# Patient Record
Sex: Male | Born: 1935 | Race: White | Hispanic: No | Marital: Married | Smoking: Former smoker
Health system: Southern US, Community
[De-identification: ages and names within clinical notes are randomized; demographics above are authoritative.]

## PROBLEM LIST (undated history)

## (undated) DIAGNOSIS — M1990 Unspecified osteoarthritis, unspecified site: Secondary | ICD-10-CM

## (undated) DIAGNOSIS — Z950 Presence of cardiac pacemaker: Secondary | ICD-10-CM

## (undated) DIAGNOSIS — G4730 Sleep apnea, unspecified: Secondary | ICD-10-CM

## (undated) DIAGNOSIS — I639 Cerebral infarction, unspecified: Secondary | ICD-10-CM

## (undated) DIAGNOSIS — R413 Other amnesia: Secondary | ICD-10-CM

## (undated) DIAGNOSIS — M47816 Spondylosis without myelopathy or radiculopathy, lumbar region: Secondary | ICD-10-CM

## (undated) DIAGNOSIS — G8929 Other chronic pain: Secondary | ICD-10-CM

## (undated) DIAGNOSIS — E119 Type 2 diabetes mellitus without complications: Secondary | ICD-10-CM

## (undated) DIAGNOSIS — I10 Essential (primary) hypertension: Secondary | ICD-10-CM

## (undated) DIAGNOSIS — K759 Inflammatory liver disease, unspecified: Secondary | ICD-10-CM

## (undated) DIAGNOSIS — R251 Tremor, unspecified: Secondary | ICD-10-CM

## (undated) HISTORY — PX: PACEMAKER INSERTION: SHX728

## (undated) HISTORY — PX: LAPAROSCOPIC ABLATION RENAL MASS: SUR751

---

## 1989-11-18 DIAGNOSIS — I2510 Atherosclerotic heart disease of native coronary artery without angina pectoris: Secondary | ICD-10-CM

## 1989-11-18 HISTORY — DX: Atherosclerotic heart disease of native coronary artery without angina pectoris: I25.10

## 1989-11-18 HISTORY — PX: CORONARY ARTERY BYPASS GRAFT: SHX141

## 2019-07-27 ENCOUNTER — Other Ambulatory Visit: Payer: Self-pay | Admitting: Urology

## 2019-07-27 DIAGNOSIS — N2889 Other specified disorders of kidney and ureter: Secondary | ICD-10-CM

## 2019-07-28 ENCOUNTER — Other Ambulatory Visit: Payer: Self-pay

## 2019-07-28 ENCOUNTER — Encounter: Payer: Self-pay | Admitting: *Deleted

## 2019-07-28 ENCOUNTER — Ambulatory Visit
Admission: RE | Admit: 2019-07-28 | Discharge: 2019-07-28 | Disposition: A | Discharge status: Home / Self Care | Payer: Medicare Other | Source: Ambulatory Visit | Attending: Urology | Admitting: Urology

## 2019-07-28 DIAGNOSIS — N2889 Other specified disorders of kidney and ureter: Secondary | ICD-10-CM

## 2019-07-28 HISTORY — PX: IR RADIOLOGIST EVAL & MGMT: IMG5224

## 2019-07-28 NOTE — Final Consult Note (Signed)
Chief Complaint: Patient was consulted remotely today (TeleHealth) for Right renal mass at the request of Hall,Marshall C.    Referring Physician(s): Hall,Marshall C  History of Present Illness: Jake Woods is a 83 y.o. male with multiple comorbidities including known coronary disease, previous carotid bypass, hypertension, diabetes, mild renal insufficiency, and previous pacemaker.  He presents for consultation to discuss an incidentally found right renal mass consistent with a renal cell carcinoma by imaging.  By contrast CT has a 3 cm exophytic upper pole solid enhancing renal mass consistent with a renal cell carcinoma.  Additional small bilateral scratch that additional bilateral renal cyst noted.  No renal obstruction or hydronephrosis.  No adenopathy.  Patient remains asymptomatic.  No significant abdominal pain or flank pain.  No hematuria or dysuria.  He does have chronic kidney disease with a mildly elevated creatinine of 1.68.  He also has a history of longstanding BPH.  No past medical history on file.    Allergies: Patient has no allergy information on record.  Medications: Prior to Admission medications   Not on File     No family history on file.  Social History   Socioeconomic History  . Marital status: Not on file    Spouse name: Not on file  . Number of children: Not on file  . Years of education: Not on file  . Highest education level: Not on file  Occupational History  . Not on file  Social Needs  . Financial resource strain: Not on file  . Food insecurity    Worry: Not on file    Inability: Not on file  . Transportation needs    Medical: Not on file    Non-medical: Not on file  Tobacco Use  . Smoking status: Not on file  Substance and Sexual Activity  . Alcohol use: Not on file  . Drug use: Not on file  . Sexual activity: Not on file  Lifestyle  . Physical activity    Days per week: Not on file    Minutes per session: Not on file  .  Stress: Not on file  Relationships  . Social Herbalist on phone: Not on file    Gets together: Not on file    Attends religious service: Not on file    Active member of club or organization: Not on file    Attends meetings of clubs or organizations: Not on file    Relationship status: Not on file  Other Topics Concern  . Not on file  Social History Narrative  . Not on file    Review of Systems  Review of Systems: A 12 point ROS discussed and pertinent positives are indicated in the HPI above.  All other systems are negative.  Physical Exam No direct physical exam was performed, tele visit only today Vital Signs: There were no vitals taken for this visit.  Imaging: No results found.  Labs:  CBC: No results for input(s): WBC, HGB, HCT, PLT in the last 8760 hours.  COAGS: No results for input(s): INR, APTT in the last 8760 hours.  BMP: No results for input(s): NA, K, CL, CO2, GLUCOSE, BUN, CALCIUM, CREATININE, GFRNONAA, GFRAA in the last 8760 hours.  Invalid input(s): CMP  LIVER FUNCTION TESTS: No results for input(s): BILITOT, AST, ALT, ALKPHOS, PROT, ALBUMIN in the last 8760 hours.  TUMOR MARKERS: No results for input(s): AFPTM, CEA, CA199, CHROMGRNA in the last 8760 hours.  Assessment and Plan:  Incidentally found 3 cm solid enhancing exophytic right upper pole renal mass compatible with renal cell carcinoma.  Lesion size and location is amenable to image guided cryoablation.  The procedure, risk, benefits and alternatives were reviewed.  The need for general anesthesia was reviewed.  The expected goals, recovery and outcomes as well as continued imaging surveillance were reviewed.  All questions were addressed.  They have a clear understanding of the procedure.  Surveillance was also discussed.  After discussion they would like to proceed with scheduling the CT-guided cryoablation and biopsy at [facility].  Plan: Scheduled for CT-guided right  renal mass biopsy and cryoablation at [facility] in the next few weeks electively.  Thank you for this interesting consult.  I greatly enjoyed meeting Jake Woods and look forward to participating in their care.  A copy of this report was sent to the requesting provider on this date.  Electronically Signed: Berdine DanceMichael Mahari Strahm 07/28/2019, 3:08 PM   I spent a total of  40 Minutes   in remote  clinical consultation, greater than 50% of which was counseling/coordinating care for this patient with a 3 cm right renal cell carcinoma.    Visit type: Audio only (telephone). Audio (no video) only due to patient's lack of internet/smartphone capability. Alternative for in-person consultation at [facility], [address], [place], [place]. This visit type was conducted due to national recommendations for restrictions regarding the COVID-19 Pandemic (e.g. social distancing).  This format is felt to be most appropriate for this patient at this time.  All issues noted in this document were discussed and addressed.

## 2019-10-12 ENCOUNTER — Other Ambulatory Visit: Payer: Self-pay | Admitting: Interventional Radiology

## 2019-10-12 DIAGNOSIS — N2889 Other specified disorders of kidney and ureter: Secondary | ICD-10-CM

## 2019-10-28 ENCOUNTER — Ambulatory Visit
Admission: RE | Admit: 2019-10-28 | Discharge: 2019-10-28 | Disposition: A | Discharge status: Home / Self Care | Payer: Medicare Other | Source: Ambulatory Visit | Attending: Interventional Radiology | Admitting: Interventional Radiology

## 2019-10-28 ENCOUNTER — Other Ambulatory Visit: Payer: Self-pay

## 2019-10-28 DIAGNOSIS — N2889 Other specified disorders of kidney and ureter: Secondary | ICD-10-CM

## 2019-10-28 HISTORY — PX: IR RADIOLOGIST EVAL & MGMT: IMG5224

## 2019-10-28 NOTE — Progress Notes (Signed)
Patient ID: Jake Woods, male   DOB: 1935/11/29, 83 y.o.   MRN: [id number]       Chief Complaint:  1 month status post right renal mass cryoablation and biopsy.  Telehealth follow-up because of Covid pandemic.  Referring Physician(s): Dr. Nevada Crane  History of Present Illness: Jake Woods is a 83 y.o. male with multiple comorbidities including known coronary disease, previous carotid bypass, hypertension, diabetes, and pacemaker.  He was found to have a 3 cm exophytic upper pole solid enhancing renal mass in the right kidney.  This was successfully treated with CT-guided cryoablation and biopsy 1 month ago at [facility].  Procedure went well without complication.  Over the last month he has recovered well.  No current flank or abdominal pain.  No dysuria, hematuria or other urinary tract symptoms.  No recent illness or fever.  Overall he is doing very well.  Telehealth visit today for follow-up.  No interval imaging.  Pathology revealed renal oncocytoma.  No past medical history on file.    Allergies: Patient has no allergy information on record.  Medications: Prior to Admission medications   Not on File     No family history on file.  Social History   Socioeconomic History  . Marital status: Not on file    Spouse name: Not on file  . Number of children: Not on file  . Years of education: Not on file  . Highest education level: Not on file  Occupational History  . Not on file  Tobacco Use  . Smoking status: Not on file  Substance and Sexual Activity  . Alcohol use: Not on file  . Drug use: Not on file  . Sexual activity: Not on file  Other Topics Concern  . Not on file  Social History Narrative  . Not on file   Social Determinants of Health   Financial Resource Strain:   . Difficulty of Paying Living Expenses: Not on file  Food Insecurity:   . Worried About Charity fundraiser in the Last Year: Not on file  . Ran Out of Food in the Last Year: Not on file   Transportation Needs:   . Lack of Transportation (Medical): Not on file  . Lack of Transportation (Non-Medical): Not on file  Physical Activity:   . Days of Exercise per Week: Not on file  . Minutes of Exercise per Session: Not on file  Stress:   . Feeling of Stress : Not on file  Social Connections:   . Frequency of Communication with Friends and Family: Not on file  . Frequency of Social Gatherings with Friends and Family: Not on file  . Attends Religious Services: Not on file  . Active Member of Clubs or Organizations: Not on file  . Attends Archivist Meetings: Not on file  . Marital Status: Not on file     Review of Systems: A 12 point ROS discussed and pertinent positives are indicated in the HPI above.  All other systems are negative.  Physical Exam No direct physical exam was performed, telephone visit only today because of Covid pandemic. Vital Signs: There were no vitals taken for this visit.  Imaging: No results found.  Labs:  CBC: No results for input(s): WBC, HGB, HCT, PLT in the last 8760 hours.  COAGS: No results for input(s): INR, APTT in the last 8760 hours.  BMP: No results for input(s): NA, K, CL, CO2, GLUCOSE, BUN, CALCIUM, CREATININE, GFRNONAA, GFRAA in the last  8760 hours.  Invalid input(s): CMP  LIVER FUNCTION TESTS: No results for input(s): BILITOT, AST, ALT, ALKPHOS, PROT, ALBUMIN in the last 8760 hours.  TUMOR MARKERS: No results for input(s): AFPTM, CEA, CA199, CHROMGRNA in the last 8760 hours.  Assessment and Plan:  1 month status post right renal mass biopsy and cryoablation performed at [facility].  He has recovered at home very well.  No delayed complication or symptoms.  Biopsy revealed renal oncocytoma.  Pathology reviewed with the patient and his family.  Today's visit was by telephone.  All questions and concerns were addressed.  Plan: Initial outpatient follow-up CT without and with contrast in 3 months to be  performed in [facility].  Thank you for this interesting consult.  I greatly enjoyed meeting Jake Woods and look forward to participating in their care.  A copy of this report was sent to the requesting provider on this date.  Electronically Signed: Berdine Dance 10/28/2019, 10:35 AM   I spent a total of    40 Minutes in remote  clinical consultation, greater than 50% of which was counseling/coordinating care for this patient status post cryoablation of a renal oncocytoma.    Visit type: Audio only (telephone). Audio (no video) only due to patient's lack of internet/smartphone capability. Alternative for in-person consultation at [facility], [address] [place], [place], [place]. This visit type was conducted due to national recommendations for restrictions regarding the COVID-19 Pandemic (e.g. social distancing).  This format is felt to be most appropriate for this patient at this time.  All issues noted in this document were discussed and addressed.

## 2020-01-10 ENCOUNTER — Other Ambulatory Visit: Payer: Self-pay | Admitting: Interventional Radiology

## 2020-01-10 ENCOUNTER — Other Ambulatory Visit: Payer: Self-pay

## 2020-01-10 DIAGNOSIS — N2889 Other specified disorders of kidney and ureter: Secondary | ICD-10-CM

## 2020-01-13 ENCOUNTER — Other Ambulatory Visit: Payer: Self-pay | Admitting: Otolaryngology

## 2020-01-18 ENCOUNTER — Other Ambulatory Visit: Payer: Self-pay

## 2020-01-18 ENCOUNTER — Encounter (HOSPITAL_BASED_OUTPATIENT_CLINIC_OR_DEPARTMENT_OTHER): Payer: Self-pay | Admitting: Otolaryngology

## 2020-01-20 ENCOUNTER — Other Ambulatory Visit (HOSPITAL_COMMUNITY)
Admission: RE | Admit: 2020-01-20 | Discharge: 2020-01-20 | Disposition: A | Discharge status: Home / Self Care | Payer: Medicare Other | Source: Ambulatory Visit | Attending: Otolaryngology | Admitting: Otolaryngology

## 2020-01-20 ENCOUNTER — Encounter (HOSPITAL_BASED_OUTPATIENT_CLINIC_OR_DEPARTMENT_OTHER)
Admission: RE | Admit: 2020-01-20 | Discharge: 2020-01-20 | Disposition: A | Discharge status: Home / Self Care | Payer: Medicare Other | Source: Ambulatory Visit | Attending: Otolaryngology | Admitting: Otolaryngology

## 2020-01-20 DIAGNOSIS — Z20822 Contact with and (suspected) exposure to covid-19: Secondary | ICD-10-CM | POA: Diagnosis not present

## 2020-01-20 DIAGNOSIS — Z01812 Encounter for preprocedural laboratory examination: Secondary | ICD-10-CM | POA: Diagnosis present

## 2020-01-20 LAB — BASIC METABOLIC PANEL
Anion gap: 8 (ref 5–15)
BUN: 42 mg/dL — ABNORMAL HIGH (ref 8–23)
CO2: 23 mmol/L (ref 22–32)
Calcium: 9.2 mg/dL (ref 8.9–10.3)
Chloride: 111 mmol/L (ref 98–111)
Creatinine, Ser: 1.88 mg/dL — ABNORMAL HIGH (ref 0.61–1.24)
GFR calc Af Amer: 38 mL/min — ABNORMAL LOW (ref >60)
GFR calc non Af Amer: 33 mL/min — ABNORMAL LOW (ref >60)
Glucose, Bld: 114 mg/dL — ABNORMAL HIGH (ref 70–99)
Potassium: 4.6 mmol/L (ref 3.5–5.1)
Sodium: 142 mmol/L (ref 135–145)

## 2020-01-20 LAB — SARS CORONAVIRUS 2 (TAT 6-24 HRS): SARS Coronavirus 2: NEGATIVE

## 2020-01-21 NOTE — Progress Notes (Signed)
Notified Dr. Jenne Pane office Marian Sorrow) that we need medical clearance for this patient prior to surgery on 01/24/20, per Dr. Chilton Si (anesthesiologist)

## 2020-01-24 ENCOUNTER — Ambulatory Visit (HOSPITAL_BASED_OUTPATIENT_CLINIC_OR_DEPARTMENT_OTHER): Admission: RE | Admit: 2020-01-24 | Payer: Medicare Other | Source: Home / Self Care | Admitting: Otolaryngology

## 2020-01-24 HISTORY — DX: Presence of cardiac pacemaker: Z95.0

## 2020-01-24 HISTORY — DX: Type 2 diabetes mellitus without complications: E11.9

## 2020-01-24 HISTORY — DX: Sleep apnea, unspecified: G47.30

## 2020-01-24 HISTORY — DX: Tremor, unspecified: R25.1

## 2020-01-24 HISTORY — DX: Unspecified osteoarthritis, unspecified site: M19.90

## 2020-01-24 HISTORY — DX: Essential (primary) hypertension: I10

## 2020-01-24 HISTORY — DX: Other chronic pain: G89.29

## 2020-01-24 HISTORY — DX: Other amnesia: R41.3

## 2020-01-24 SURGERY — DRUG INDUCED SLEEP ENDOSCOPY
Anesthesia: General

## 2020-02-15 ENCOUNTER — Encounter: Payer: Self-pay | Admitting: *Deleted

## 2020-02-15 ENCOUNTER — Other Ambulatory Visit: Payer: Self-pay

## 2020-02-15 ENCOUNTER — Ambulatory Visit
Admission: RE | Admit: 2020-02-15 | Discharge: 2020-02-15 | Disposition: A | Discharge status: Home / Self Care | Payer: Medicare Other | Source: Ambulatory Visit | Attending: Interventional Radiology | Admitting: Interventional Radiology

## 2020-02-15 DIAGNOSIS — N2889 Other specified disorders of kidney and ureter: Secondary | ICD-10-CM

## 2020-02-15 HISTORY — PX: IR RADIOLOGIST EVAL & MGMT: IMG5224

## 2020-02-15 NOTE — Progress Notes (Signed)
Patient ID: Jake Woods, male   DOB: 1936/02/07, 84 y.o.   MRN: [id number]       Chief Complaint:  83-month status post right renal mass cryoablation and biopsy.  Telehealth follow-up because of Covid pandemic  Referring Physician(s): Dr. Nevada Crane  History of Present Illness: Jake Woods is a 84 y.o. male with multiple comorbidities including heart disease, previous bypass, pacemaker.  Patient was found to have a 3 cm exophytic upper pole solid enhancing mass in the right kidney.  This underwent successful CT-guided cryoablation and biopsy 09/30/2019 at [facility].  Biopsy confirmed oncocytoma.  He continues to do very well.  He is completely recovered.  No current flank abdominal pain.  No other urinary tract symptoms.  No recent illness or fever.  Overall he is doing very well.  Interval surveillance CT demonstrates stable ablation changes.  No residual or recurrent enhancing abnormality.  This will serve as his new baseline exam.  No new renal abnormality.  No delayed complication.  Past Medical History:  Diagnosis Date  . Arthritis    hands  . Chronic back pain   . Coronary artery disease 1991   CABG at [facility]  . Diabetes mellitus without complication ([place])   . Hypertension   . Memory deficits   . Presence of permanent cardiac pacemaker    SSS  . Sleep apnea    did not tolerate CPAP  . Tremor     Past Surgical History:  Procedure Laterality Date  . CORONARY ARTERY BYPASS GRAFT  1991  . IR RADIOLOGIST EVAL & MGMT  07/28/2019  . IR RADIOLOGIST EVAL & MGMT  10/28/2019  . LAPAROSCOPIC ABLATION RENAL MASS Right   . PACEMAKER INSERTION      Allergies: Ambien [zolpidem], Penicillins, and Viagra [sildenafil]  Medications: Prior to Admission medications   Medication Sig Start Date End Date Taking? Authorizing Provider  aspirin EC 81 MG tablet Take 81 mg by mouth daily.    [provider]  atorvastatin (LIPITOR) 80 MG tablet Take 80 mg by mouth daily.    [provider]  celecoxib (CELEBREX) 100 MG capsule Take 100 mg by mouth 2 (two) times daily.    [provider]  donepezil (ARICEPT) 10 MG tablet Take 10 mg by mouth at bedtime.    [provider]  glucosamine-chondroitin 500-400 MG tablet Take 1 tablet by mouth 3 (three) times daily.    [provider]  memantine (NAMENDA) 10 MG tablet Take 10 mg by mouth 2 (two) times daily.    [provider]  metFORMIN (GLUCOPHAGE-XR) 500 MG 24 hr tablet Take 500 mg by mouth daily with breakfast.    [provider]  metoprolol succinate (TOPROL-XL) 50 MG 24 hr tablet Take 50 mg by mouth daily. Take with or immediately following a meal.    [provider]  Multiple Vitamin (MULTIVITAMIN WITH MINERALS) TABS tablet Take 1 tablet by mouth daily.    [provider]  Multiple Vitamins-Minerals (PRESERVISION AREDS 2 PO) Take by mouth.    [provider]  Omega 3 1200 MG CAPS Take by mouth.    [provider]  silodosin (RAPAFLO) 4 MG CAPS capsule Take 4 mg by mouth daily with breakfast.    [provider]  telmisartan-hydrochlorothiazide (MICARDIS HCT) 80-25 MG tablet Take 1 tablet by mouth daily.    [provider]  tolterodine (DETROL LA) 4 MG 24 hr capsule Take 4 mg by mouth daily.  [provider]  vitamin B-12 (CYANOCOBALAMIN) 100 MCG tablet Take 100 mcg by mouth daily.    [provider]     No family history on file.  Social History   Socioeconomic History  . Marital status: Married    Spouse name: Not on file  . Number of children: Not on file  . Years of education: Not on file  . Highest education level: Not on file  Occupational History  . Not on file  Tobacco Use  . Smoking status: Former Games developer  . Smokeless tobacco: Never Used  Substance and Sexual Activity  . Alcohol use: Never  . Drug use: Never  . Sexual activity: Not on file  Other Topics Concern  . Not on file    Social History Narrative  . Not on file   Social Determinants of Health   Financial Resource Strain:   . Difficulty of Paying Living Expenses:   Food Insecurity:   . Worried About Programme researcher, broadcasting/film/video in the Last Year:   . Barista in the Last Year:   Transportation Needs:   . Freight forwarder (Medical):   Marland Kitchen Lack of Transportation (Non-Medical):   Physical Activity:   . Days of Exercise per Week:   . Minutes of Exercise per Session:   Stress:   . Feeling of Stress :   Social Connections:   . Frequency of Communication with Friends and Family:   . Frequency of Social Gatherings with Friends and Family:   . Attends Religious Services:   . Active Member of Clubs or Organizations:   . Attends Banker Meetings:   Marland Kitchen Marital Status:      Review of Systems  Review of Systems: A 12 point ROS discussed and pertinent positives are indicated in the HPI above.  All other systems are negative.  Physical Exam No direct physical exam was performed, telehealth visit only today because of Covid pandemic Vital Signs: There were no vitals taken for this visit.  Imaging: No results found.  Labs:  CBC: No results for input(s): WBC, HGB, HCT, PLT in the last 8760 hours.  COAGS: No results for input(s): INR, APTT in the last 8760 hours.  BMP: Recent Labs    01/20/20 0900  NA 142  K 4.6  CL 111  CO2 23  GLUCOSE 114*  BUN 42*  CALCIUM 9.2  CREATININE 1.88*  GFRNONAA 33*  GFRAA 38*    LIVER FUNCTION TESTS: No results for input(s): BILITOT, AST, ALT, ALKPHOS, PROT, ALBUMIN in the last 8760 hours.  Assessment and Plan:  1-month status post right renal mass biopsy and cryoablation.  Biopsy confirmed renal oncocytoma.  Surveillance imaging demonstrates expected post ablation defect with no residual enhancing abnormality.  No delayed complication.  Imaging findings reviewed with the patient today by telephone including his family.  All questions and  concerns addressed.  Plan: Repeat outpatient follow-up CT without and with contrast in 1 year   Electronically Signed: Berdine Dance 02/15/2020, 10:17 AM   I spent a total of    25 Minutes in remote  clinical consultation, greater than 50% of which was counseling/coordinating care for this patient status post cryoablation of a renal oncocytoma.    Visit type: Audio only (telephone). Audio (no video) only due to patient's lack of internet/smartphone capability. Alternative for in-person consultation at [facility], [address] [place], [place], [place]. This visit type was conducted due to national recommendations for restrictions regarding the  COVID-19 Pandemic (e.g. social distancing).  This format is felt to be most appropriate for this patient at this time.  All issues noted in this document were discussed and addressed.

## 2020-03-29 ENCOUNTER — Other Ambulatory Visit: Payer: Self-pay | Admitting: Otolaryngology

## 2020-04-27 ENCOUNTER — Other Ambulatory Visit: Payer: Self-pay

## 2020-04-27 ENCOUNTER — Encounter (HOSPITAL_BASED_OUTPATIENT_CLINIC_OR_DEPARTMENT_OTHER): Payer: Self-pay | Admitting: Otolaryngology

## 2020-04-27 NOTE — Progress Notes (Signed)
Follow up message left for Tami Lin, surgery scheduler with Dr Jenne Pane re: need for cardiac clearance prior to scheduled surgery.  Previous notification to her was 6/7 by Dianna Rossetti, RN

## 2020-04-29 ENCOUNTER — Other Ambulatory Visit (HOSPITAL_COMMUNITY): Payer: Medicare Other | Attending: Otolaryngology

## 2020-05-01 ENCOUNTER — Other Ambulatory Visit (HOSPITAL_COMMUNITY)
Admission: RE | Admit: 2020-05-01 | Discharge: 2020-05-01 | Disposition: A | Discharge status: Home / Self Care | Payer: Medicare Other | Source: Ambulatory Visit | Attending: Otolaryngology | Admitting: Otolaryngology

## 2020-05-01 ENCOUNTER — Encounter (HOSPITAL_BASED_OUTPATIENT_CLINIC_OR_DEPARTMENT_OTHER)
Admission: RE | Admit: 2020-05-01 | Discharge: 2020-05-01 | Disposition: A | Discharge status: Home / Self Care | Payer: Medicare Other | Source: Ambulatory Visit | Attending: Otolaryngology | Admitting: Otolaryngology

## 2020-05-01 DIAGNOSIS — Z01812 Encounter for preprocedural laboratory examination: Secondary | ICD-10-CM | POA: Diagnosis present

## 2020-05-01 DIAGNOSIS — Z20822 Contact with and (suspected) exposure to covid-19: Secondary | ICD-10-CM | POA: Insufficient documentation

## 2020-05-01 LAB — BASIC METABOLIC PANEL
Anion gap: 7 (ref 5–15)
BUN: 25 mg/dL — ABNORMAL HIGH (ref 8–23)
CO2: 23 mmol/L (ref 22–32)
Calcium: 8.9 mg/dL (ref 8.9–10.3)
Chloride: 111 mmol/L (ref 98–111)
Creatinine, Ser: 1.24 mg/dL (ref 0.61–1.24)
GFR calc Af Amer: >60 mL/min (ref >60)
GFR calc non Af Amer: 54 mL/min — ABNORMAL LOW (ref >60)
Glucose, Bld: 115 mg/dL — ABNORMAL HIGH (ref 70–99)
Potassium: 4.1 mmol/L (ref 3.5–5.1)
Sodium: 141 mmol/L (ref 135–145)

## 2020-05-01 LAB — SARS CORONAVIRUS 2 (TAT 6-24 HRS): SARS Coronavirus 2: NEGATIVE

## 2020-05-03 ENCOUNTER — Other Ambulatory Visit: Payer: Self-pay

## 2020-05-03 ENCOUNTER — Encounter (HOSPITAL_BASED_OUTPATIENT_CLINIC_OR_DEPARTMENT_OTHER): Payer: Self-pay | Admitting: Otolaryngology

## 2020-05-03 ENCOUNTER — Ambulatory Visit (HOSPITAL_BASED_OUTPATIENT_CLINIC_OR_DEPARTMENT_OTHER): Payer: Medicare Other | Admitting: Certified Registered Nurse Anesthetist

## 2020-05-03 ENCOUNTER — Ambulatory Visit (HOSPITAL_BASED_OUTPATIENT_CLINIC_OR_DEPARTMENT_OTHER)
Admission: RE | Admit: 2020-05-03 | Discharge: 2020-05-03 | Disposition: A | Discharge status: Home / Self Care | Payer: Medicare Other | Attending: Otolaryngology | Admitting: Otolaryngology

## 2020-05-03 ENCOUNTER — Encounter (HOSPITAL_BASED_OUTPATIENT_CLINIC_OR_DEPARTMENT_OTHER)
Admission: RE | Disposition: A | Discharge status: Home / Self Care | Payer: Self-pay | Source: Home / Self Care | Attending: Otolaryngology

## 2020-05-03 DIAGNOSIS — Z951 Presence of aortocoronary bypass graft: Secondary | ICD-10-CM | POA: Insufficient documentation

## 2020-05-03 DIAGNOSIS — Z79899 Other long term (current) drug therapy: Secondary | ICD-10-CM | POA: Diagnosis not present

## 2020-05-03 DIAGNOSIS — I495 Sick sinus syndrome: Secondary | ICD-10-CM | POA: Insufficient documentation

## 2020-05-03 DIAGNOSIS — G4733 Obstructive sleep apnea (adult) (pediatric): Secondary | ICD-10-CM | POA: Diagnosis not present

## 2020-05-03 DIAGNOSIS — I2510 Atherosclerotic heart disease of native coronary artery without angina pectoris: Secondary | ICD-10-CM | POA: Insufficient documentation

## 2020-05-03 DIAGNOSIS — M19041 Primary osteoarthritis, right hand: Secondary | ICD-10-CM | POA: Insufficient documentation

## 2020-05-03 DIAGNOSIS — Z880 Allergy status to penicillin: Secondary | ICD-10-CM | POA: Diagnosis not present

## 2020-05-03 DIAGNOSIS — M19042 Primary osteoarthritis, left hand: Secondary | ICD-10-CM | POA: Insufficient documentation

## 2020-05-03 DIAGNOSIS — Z888 Allergy status to other drugs, medicaments and biological substances status: Secondary | ICD-10-CM | POA: Diagnosis not present

## 2020-05-03 DIAGNOSIS — Z7984 Long term (current) use of oral hypoglycemic drugs: Secondary | ICD-10-CM | POA: Insufficient documentation

## 2020-05-03 DIAGNOSIS — Z7982 Long term (current) use of aspirin: Secondary | ICD-10-CM | POA: Diagnosis not present

## 2020-05-03 DIAGNOSIS — E119 Type 2 diabetes mellitus without complications: Secondary | ICD-10-CM | POA: Insufficient documentation

## 2020-05-03 DIAGNOSIS — Z87891 Personal history of nicotine dependence: Secondary | ICD-10-CM | POA: Diagnosis not present

## 2020-05-03 DIAGNOSIS — Z8673 Personal history of transient ischemic attack (TIA), and cerebral infarction without residual deficits: Secondary | ICD-10-CM | POA: Diagnosis not present

## 2020-05-03 DIAGNOSIS — Z791 Long term (current) use of non-steroidal anti-inflammatories (NSAID): Secondary | ICD-10-CM | POA: Insufficient documentation

## 2020-05-03 DIAGNOSIS — Z950 Presence of cardiac pacemaker: Secondary | ICD-10-CM | POA: Insufficient documentation

## 2020-05-03 DIAGNOSIS — I10 Essential (primary) hypertension: Secondary | ICD-10-CM | POA: Insufficient documentation

## 2020-05-03 HISTORY — DX: Spondylosis without myelopathy or radiculopathy, lumbar region: M47.816

## 2020-05-03 HISTORY — PX: DRUG INDUCED ENDOSCOPY: SHX6808

## 2020-05-03 HISTORY — DX: Cerebral infarction, unspecified: I63.9

## 2020-05-03 SURGERY — DRUG INDUCED SLEEP ENDOSCOPY
Anesthesia: General | Site: Nose

## 2020-05-03 MED ORDER — OXYCODONE HCL 5 MG PO TABS: 5.0000 mg | ORAL_TABLET | Freq: Once | ORAL | Status: DC | PRN

## 2020-05-03 MED ORDER — LIDOCAINE 2% (20 MG/ML) 5 ML SYRINGE
INTRAMUSCULAR | Status: AC
  Filled 2020-05-03: qty 5

## 2020-05-03 MED ORDER — PROPOFOL 10 MG/ML IV BOLUS
INTRAVENOUS | Status: DC | PRN
  Administered 2020-05-03: 10 mg via INTRAVENOUS

## 2020-05-03 MED ORDER — OXYMETAZOLINE HCL 0.05 % NA SOLN
NASAL | Status: DC | PRN
  Administered 2020-05-03: 1 via TOPICAL

## 2020-05-03 MED ORDER — OXYCODONE HCL 5 MG/5ML PO SOLN: 5.0000 mg | Freq: Once | ORAL | Status: DC | PRN

## 2020-05-03 MED ORDER — PROPOFOL 500 MG/50ML IV EMUL
INTRAVENOUS | Status: AC
  Filled 2020-05-03: qty 150

## 2020-05-03 MED ORDER — PROMETHAZINE HCL 25 MG/ML IJ SOLN: 6.2500 mg | INTRAMUSCULAR | Status: DC | PRN

## 2020-05-03 MED ORDER — HYDROMORPHONE HCL 1 MG/ML IJ SOLN: 0.2500 mg | INTRAMUSCULAR | Status: DC | PRN

## 2020-05-03 MED ORDER — LACTATED RINGERS IV SOLN
INTRAVENOUS | Status: DC | PRN
Start: 2020-05-03 — End: 2020-05-03

## 2020-05-03 MED ORDER — LIDOCAINE HCL (CARDIAC) PF 100 MG/5ML IV SOSY
PREFILLED_SYRINGE | INTRAVENOUS | Status: DC | PRN
  Administered 2020-05-03: 100 mg via INTRAVENOUS

## 2020-05-03 MED ORDER — OXYMETAZOLINE HCL 0.05 % NA SOLN
NASAL | Status: AC
  Filled 2020-05-03: qty 120

## 2020-05-03 MED ORDER — PROPOFOL 500 MG/50ML IV EMUL
INTRAVENOUS | Status: DC | PRN
  Administered 2020-05-03: 70 ug/kg/min via INTRAVENOUS

## 2020-05-03 MED ORDER — LIDOCAINE-EPINEPHRINE 1 %-1:100000 IJ SOLN
INTRAMUSCULAR | Status: AC
  Filled 2020-05-03: qty 1

## 2020-05-03 SURGICAL SUPPLY — 13 items
CANISTER SUCT 1200ML W/VALVE (MISCELLANEOUS) ×2 IMPLANT
COVER WAND RF STERILE (DRAPES) IMPLANT
GLOVE BIO SURGEON STRL SZ7.5 (GLOVE) ×2 IMPLANT
KIT CLEAN ENDO (MISCELLANEOUS) ×2 IMPLANT
NEEDLE PRECISIONGLIDE 27X1.5 (NEEDLE) IMPLANT
PATTIES SURGICAL .5 X3 (DISPOSABLE) ×2 IMPLANT
SET BASIN DAY SURGERY F.S. (CUSTOM PROCEDURE TRAY) ×2 IMPLANT
SHEET MEDIUM DRAPE 40X70 STRL (DRAPES) IMPLANT
SOL ANTI FOG 6CC (MISCELLANEOUS) ×1 IMPLANT
SOLUTION ANTI FOG 6CC (MISCELLANEOUS) ×1
SYR CONTROL 10ML LL (SYRINGE) IMPLANT
TOWEL GREEN STERILE FF (TOWEL DISPOSABLE) ×2 IMPLANT
TUBE CONNECTING 20X1/4 (TUBING) ×2 IMPLANT

## 2020-05-03 NOTE — Brief Op Note (Signed)
05/03/2020  8:49 AM  PATIENT:  Jake Woods  84 y.o. male  PRE-OPERATIVE DIAGNOSIS:  obstructive sleep apnea  POST-OPERATIVE DIAGNOSIS:  obstructive sleep apnea  PROCEDURE:  Procedure(s): DRUG INDUCED ENDOSCOPY (N/A)  SURGEON:  Surgeon(s) and Role:    Christia Reading, MD - Primary  PHYSICIAN ASSISTANT:   ASSISTANTS: none   ANESTHESIA:   IV sedation  EBL: None  BLOOD ADMINISTERED:none  DRAINS: none   LOCAL MEDICATIONS USED:  NONE  SPECIMEN:  No Specimen  DISPOSITION OF SPECIMEN:  N/A  COUNTS:  YES  TOURNIQUET:  * No tourniquets in log *  DICTATION: .Note written in EPIC  PLAN OF CARE: Discharge to home after PACU  PATIENT DISPOSITION:  PACU - hemodynamically stable.   Delay start of Pharmacological VTE agent (>24hrs) due to surgical blood loss or risk of bleeding: no

## 2020-05-03 NOTE — Discharge Instructions (Signed)

## 2020-05-03 NOTE — Op Note (Signed)
Preop diagnosis: Obstructive sleep apnea Postop diagnosis: same Procedure: Drug-induced sleep endoscopy Surgeon: Jenne Pane Anesth: IV sedation Compl: None Findings: There is total anterior-posterior collapse at the velum making him a good candidate for Inspire placement.  There was also anterior-posterior collapse at the tongue base. Description:  After discussing risks, benefits, and alternatives, the patient was brought to the operative suite and placed on the operative table in the supine position.  Anesthesia was induced and the patient was given light sedation to simulate natural sleep. When the proper level was reached, an Afrin-soaked pledget was placed in the right nasal passage for a couple of minutes and then removed.  The fiberoptic laryngoscope was then passed to view the pharynx and larynx.  Findings are noted above and the exam was recorded.  After completion, the scope was removed and the patient was returned to anesthesia for wakeup and was moved to the recovery room in stable condition.

## 2020-05-03 NOTE — H&P (Signed)
Jake Woods is an 84 y.o. male.   Chief Complaint: sleep apnea HPI: 84 year old male with obstructive sleep apnea who has been unable to tolerate CPAP.  Past Medical History:  Diagnosis Date  . Arthritis    hands  . Chronic back pain   . Coronary artery disease 1991   CABG at [facility]  . Diabetes mellitus without complication ([facility])   . Hypertension   . Memory deficits   . Presence of permanent cardiac pacemaker    SSS  . Sleep apnea    did not tolerate CPAP  . Spondylosis of lumbar spine   . Stroke [facility])    (post CABG per patient's record)  . Tremor     Past Surgical History:  Procedure Laterality Date  . CORONARY ARTERY BYPASS GRAFT  1991  . IR RADIOLOGIST EVAL & MGMT  07/28/2019  . IR RADIOLOGIST EVAL & MGMT  10/28/2019  . IR RADIOLOGIST EVAL & MGMT  02/15/2020  . LAPAROSCOPIC ABLATION RENAL MASS Right   . PACEMAKER INSERTION      History reviewed. No pertinent family history. Social History:  reports that he has quit smoking. He has never used smokeless tobacco. He reports that he does not drink alcohol and does not use drugs.  Allergies:  Allergies  Allergen Reactions  . Ambien [Zolpidem]   . Penicillins   . Viagra [Sildenafil]     Medications Prior to Admission  Medication Sig Dispense Refill  . aspirin EC 81 MG tablet Take 81 mg by mouth daily.    Marland Kitchen atorvastatin (LIPITOR) 80 MG tablet Take 80 mg by mouth daily.    Marland Kitchen donepezil (ARICEPT) 10 MG tablet Take 10 mg by mouth at bedtime.    . ferrous sulfate 325 (65 FE) MG tablet Take 325 mg by mouth daily with breakfast.    . glucosamine-chondroitin 500-400 MG tablet Take 1 tablet by mouth 3 (three) times daily.    . memantine (NAMENDA) 10 MG tablet Take 10 mg by mouth 2 (two) times daily.    . metoprolol succinate (TOPROL-XL) 50 MG 24 hr tablet Take 50 mg by mouth daily. Take with or immediately following a meal.    . Multiple Vitamin (MULTIVITAMIN WITH MINERALS) TABS tablet Take 1 tablet by mouth daily.    . Multiple  Vitamins-Minerals (PRESERVISION AREDS 2 PO) Take by mouth.    . Omega 3 1200 MG CAPS Take by mouth.    . telmisartan (MICARDIS) 80 MG tablet Take 80 mg by mouth daily.    Marland Kitchen telmisartan-hydrochlorothiazide (MICARDIS HCT) 80-25 MG tablet Take 1 tablet by mouth daily.    Marland Kitchen tolterodine (DETROL LA) 4 MG 24 hr capsule Take 4 mg by mouth daily.    . vitamin B-12 (CYANOCOBALAMIN) 100 MCG tablet Take 100 mcg by mouth daily.    . celecoxib (CELEBREX) 100 MG capsule Take 100 mg by mouth 2 (two) times daily.    . metFORMIN (GLUCOPHAGE-XR) 500 MG 24 hr tablet Take 500 mg by mouth daily with breakfast.    . silodosin (RAPAFLO) 4 MG CAPS capsule Take 4 mg by mouth daily with breakfast.      Results for orders placed or performed during the hospital encounter of 05/03/20 (from the past 48 hour(s))  Basic metabolic panel     Status: Abnormal   Collection Time: 05/01/20 11:30 AM  Result Value Ref Range   Sodium 141 135 - 145 mmol/L   Potassium 4.1 3.5 - 5.1 mmol/L   Chloride  111 98 - 111 mmol/L   CO2 23 22 - 32 mmol/L   Glucose, Bld 115 (H) 70 - 99 mg/dL    Comment: Glucose reference range applies only to samples taken after fasting for at least 8 hours.   BUN 25 (H) 8 - 23 mg/dL   Creatinine, Ser 1.24 0.61 - 1.24 mg/dL   Calcium 8.9 8.9 - 10.3 mg/dL   GFR calc non Af Amer 54 (L) >60 mL/min   GFR calc Af Amer >60 >60 mL/min   Anion gap 7 5 - 15    Comment: Performed at [facility] [address], [place], [place] [address]   No results found.  Review of Systems  All other systems reviewed and are negative.   Blood pressure 134/64, pulse 74, temperature 97.6 F (36.4 C), temperature source Oral, resp. rate 20, height 5\' 4"  (1.626 m), weight 81 kg, SpO2 96 %. Physical Exam  HENT:  Head: Normocephalic and atraumatic.  Right Ear: External ear normal.  Left Ear: External ear normal.  Nose: Nose normal.  Mouth/Throat: Mucous membranes are moist. Oropharynx is clear.  Eyes: Pupils are  equal, round, and reactive to light. Conjunctivae are normal.  Cardiovascular: Normal rate.  Respiratory: Effort normal.  GI: Normal appearance.  Musculoskeletal:     Cervical back: Normal range of motion and neck supple.  Skin: Skin is warm.  Psychiatric: His behavior is normal. Mood, judgment and thought content normal.     Assessment/Plan OSA  To OR for DISE.  Melida Quitter, MD 05/03/2020, 8:24 AM

## 2020-05-03 NOTE — Anesthesia Preprocedure Evaluation (Signed)
Anesthesia Evaluation  Patient identified by MRN, date of birth, ID band Patient awake    Reviewed: Allergy & Precautions, NPO status , Patient's Chart, lab work & pertinent test results, reviewed documented beta blocker date and time   Airway Mallampati: II  TM Distance: >3 FB Neck ROM: Full    Dental no notable dental hx.    Pulmonary sleep apnea , former smoker,    Pulmonary exam normal breath sounds clear to auscultation       Cardiovascular hypertension, Pt. on medications and Pt. on home beta blockers + CAD and + CABG  Normal cardiovascular exam+ pacemaker  Rhythm:Regular Rate:Normal     Neuro/Psych CVA negative psych ROS   GI/Hepatic negative GI ROS, Neg liver ROS,   Endo/Other  negative endocrine ROSdiabetes, Type 2  Renal/GU negative Renal ROS  negative genitourinary   Musculoskeletal  (+) Arthritis , Osteoarthritis,    Abdominal (+) + obese,   Peds negative pediatric ROS (+)  Hematology negative hematology ROS (+)   Anesthesia Other Findings   Reproductive/Obstetrics negative OB ROS                             Anesthesia Physical Anesthesia Plan  ASA: III  Anesthesia Plan: General   Post-op Pain Management:    Induction: Intravenous  PONV Risk Score and Plan: 2 and Ondansetron, Midazolam and Treatment may vary due to age or medical condition  Airway Management Planned:   Additional Equipment:   Intra-op Plan:   Post-operative Plan:   Informed Consent: I have reviewed the patients History and Physical, chart, labs and discussed the procedure including the risks, benefits and alternatives for the proposed anesthesia with the patient or authorized representative who has indicated his/her understanding and acceptance.     Dental advisory given  Plan Discussed with: CRNA  Anesthesia Plan Comments:         Anesthesia Quick Evaluation

## 2020-05-03 NOTE — Transfer of Care (Signed)
Immediate Anesthesia Transfer of Care Note  Patient: Jake Woods  Procedure(s) Performed: DRUG INDUCED ENDOSCOPY (N/A Nose)  Patient Location: PACU  Anesthesia Type:MAC  Level of Consciousness: awake, alert  and oriented  Airway & Oxygen Therapy: Patient Spontanous Breathing and Patient connected to face mask oxygen  Post-op Assessment: Report given to RN and Post -op Vital signs reviewed and stable  Post vital signs: Reviewed and stable  Last Vitals:  Vitals Value Taken Time  BP 109/70 05/03/20 0853  Temp    Pulse 79 05/03/20 0854  Resp 12 05/03/20 0854  SpO2 96 % 05/03/20 0854  Vitals shown include unvalidated device data.  Last Pain:  Vitals:   05/03/20 0739  TempSrc: Oral         Complications: No complications documented.

## 2020-05-03 NOTE — Anesthesia Postprocedure Evaluation (Signed)
Anesthesia Post Note  Patient: Jake Woods  Procedure(s) Performed: DRUG INDUCED ENDOSCOPY (N/A Nose)     Patient location during evaluation: PACU Anesthesia Type: General Level of consciousness: awake and alert Pain management: pain level controlled Vital Signs Assessment: post-procedure vital signs reviewed and stable Respiratory status: spontaneous breathing, nonlabored ventilation and respiratory function stable Cardiovascular status: blood pressure returned to baseline and stable Postop Assessment: no apparent nausea or vomiting Anesthetic complications: no   No complications documented.  Last Vitals:  Vitals:   05/03/20 0915 05/03/20 0938  BP: 123/70 (!) 141/76  Pulse: 75 73  Resp: 11 18  Temp: 36.6 C 36.6 C  SpO2: 95% 96%    Last Pain:  Vitals:   05/03/20 0938  TempSrc: Oral  PainSc: 0-No pain                 Lowella Curb

## 2020-05-04 ENCOUNTER — Encounter (HOSPITAL_BASED_OUTPATIENT_CLINIC_OR_DEPARTMENT_OTHER): Payer: Self-pay | Admitting: Otolaryngology

## 2020-05-09 ENCOUNTER — Other Ambulatory Visit: Payer: Self-pay | Admitting: Otolaryngology

## 2020-06-09 NOTE — Progress Notes (Signed)
[employer] Rep Kipp Brood) called and made aware of patient's upcoming procedure: Hypoglossal nerve stimulator implant with Dr. Jenne Pane at 10:30 on 06/14/20.

## 2020-06-09 NOTE — Progress Notes (Signed)
Your procedure is scheduled on Wednesday July 28.  Report to [facility] Main Entrance "A" at 08:30 A.M., and check in at the Admitting office.  Call this number if you have problems the morning of surgery: [phone]  Call [phone] if you have any questions prior to your surgery date Monday-Friday 8am-4pm   Remember: Do not eat or drink after midnight the night before your surgery    Take these medicines the morning of surgery with A SIP OF WATER: atorvastatin (LIPITOR)  memantine (NAMENDA)  metoprolol succinate (TOPROL-XL)  tolterodine (DETROL LA)   Follow your surgeon's instructions on when to stop Aspirin.  If no instructions were given by your surgeon then you will need to call the office to get those instructions.     As of today, STOP taking any Aspirin containing products, Aleve, Naproxen, Ibuprofen, Motrin, Advil, Goody's, BC's, all herbal medications, fish oil, and all vitamins.    HOW TO MANAGE YOUR DIABETES BEFORE AND AFTER SURGERY  Why is it important to control my blood sugar before and after surgery? . Improving blood sugar levels before and after surgery helps healing and can limit problems. . A way of improving blood sugar control is eating a healthy diet by: o  Eating less sugar and carbohydrates o  Increasing activity/exercise o  Talking with your doctor about reaching your blood sugar goals . High blood sugars (greater than 180 mg/dL) can raise your risk of infections and slow your recovery, so you will need to focus on controlling your diabetes during the weeks before surgery. . Make sure that the doctor who takes care of your diabetes knows about your planned surgery including the date and location.  How do I manage my blood sugar before surgery? . Check your blood sugar at least 4 times a day, starting 2 days before surgery, to make sure that the level is not too high or low. . Check your blood sugar the morning of your surgery when you wake up  and every 2 hours until you get to the Short Stay unit. o If your blood sugar is less than 70 mg/dL, you will need to treat for low blood sugar: - Do not take insulin. - Treat a low blood sugar (less than 70 mg/dL) with  cup of clear juice (cranberry or apple), 4 glucose tablets, OR glucose gel. - Recheck blood sugar in 15 minutes after treatment (to make sure it is greater than 70 mg/dL). If your blood sugar is not greater than 70 mg/dL on recheck, call [phone] for further instructions. . Report your blood sugar to the short stay nurse when you get to Short Stay.  . If you are admitted to the hospital after surgery: o Your blood sugar will be checked by the staff and you will probably be given insulin after surgery (instead of oral diabetes medicines) to make sure you have good blood sugar levels. o The goal for blood sugar control after surgery is 80-180 mg/dL.     The Morning of Surgery  Do not wear jewelry  Do not wear lotions, powders, colognes, or deodorant  Men may shave face and neck.  Do not bring valuables to the hospital.  [facility] is not responsible for any belongings or valuables.  If you are a smoker, DO NOT Smoke 24 hours prior to surgery  If you wear a CPAP at night please bring your mask the morning of surgery   Remember that you must have someone to transport  you home after your surgery, and remain with you for 24 hours if you are discharged the same day.   Please bring cases for contacts, glasses, hearing aids, dentures or bridgework because it cannot be worn into surgery.    Leave your suitcase in the car.  After surgery it may be brought to your room.  For patients admitted to the hospital, discharge time will be determined by your treatment team.  Patients discharged the day of surgery will not be allowed to drive home.    Special instructions:   [facility]- Preparing For Surgery  Before surgery, you can play an important role. Because skin is  not sterile, your skin needs to be as free of germs as possible. You can reduce the number of germs on your skin by washing with CHG (chlorahexidine gluconate) Soap before surgery.  CHG is an antiseptic cleaner which kills germs and bonds with the skin to continue killing germs even after washing.    Oral Hygiene is also important to reduce your risk of infection.  Remember - BRUSH YOUR TEETH THE MORNING OF SURGERY WITH YOUR REGULAR TOOTHPASTE  Please do not use if you have an allergy to CHG or antibacterial soaps. If your skin becomes reddened/irritated stop using the CHG.  Do not shave (including legs and underarms) for at least 48 hours prior to first CHG shower. It is OK to shave your face.  Please follow these instructions carefully.   1. Shower the NIGHT BEFORE SURGERY and the MORNING OF SURGERY with CHG Soap.   2. If you chose to wash your hair and body, wash as usual with your normal shampoo and body-wash/soap.  3. Rinse your hair and body thoroughly to remove the shampoo and soap.  4. Apply CHG directly to the skin (ONLY FROM THE NECK DOWN) and wash gently with a scrungie or a clean washcloth.   5. Do not use on open wounds or open sores. Avoid contact with your eyes, ears, mouth and genitals (private parts). Wash Face and genitals (private parts)  with your normal soap.   6. Wash thoroughly, paying special attention to the area where your surgery will be performed.  7. Thoroughly rinse your body with warm water from the neck down.  8. DO NOT shower/wash with your normal soap after using and rinsing off the CHG Soap.  9. Pat yourself dry with a CLEAN TOWEL.  10. Wear CLEAN PAJAMAS to bed the night before surgery  11. Place CLEAN SHEETS on your bed the night of your first shower and DO NOT SLEEP WITH PETS.  12. Wear comfortable clothes the morning of surgery.     Day of Surgery:  Please shower the morning of surgery with the CHG soap Do not apply any  deodorants/lotions. Please wear clean clothes to the hospital/surgery center.   Remember to brush your teeth WITH YOUR REGULAR TOOTHPASTE.   Please read over the following fact sheets that you were given.

## 2020-06-12 ENCOUNTER — Encounter (HOSPITAL_COMMUNITY)
Admission: RE | Admit: 2020-06-12 | Discharge: 2020-06-12 | Disposition: A | Discharge status: Home / Self Care | Payer: Medicare Other | Source: Ambulatory Visit | Attending: Otolaryngology | Admitting: Otolaryngology

## 2020-06-12 ENCOUNTER — Encounter (HOSPITAL_COMMUNITY): Payer: Self-pay

## 2020-06-12 ENCOUNTER — Other Ambulatory Visit (HOSPITAL_COMMUNITY)
Admission: RE | Admit: 2020-06-12 | Discharge: 2020-06-12 | Disposition: A | Discharge status: Home / Self Care | Payer: Medicare Other | Source: Ambulatory Visit | Attending: Otolaryngology | Admitting: Otolaryngology

## 2020-06-12 ENCOUNTER — Other Ambulatory Visit (HOSPITAL_COMMUNITY): Payer: Medicare Other

## 2020-06-12 ENCOUNTER — Other Ambulatory Visit: Payer: Self-pay

## 2020-06-12 DIAGNOSIS — Z01812 Encounter for preprocedural laboratory examination: Secondary | ICD-10-CM | POA: Diagnosis present

## 2020-06-12 DIAGNOSIS — Z20822 Contact with and (suspected) exposure to covid-19: Secondary | ICD-10-CM | POA: Diagnosis not present

## 2020-06-12 HISTORY — DX: Inflammatory liver disease, unspecified: K75.9

## 2020-06-12 LAB — CBC
HCT: 42 % (ref 39.0–52.0)
Hemoglobin: 13.3 g/dL (ref 13.0–17.0)
MCH: 29.9 pg (ref 26.0–34.0)
MCHC: 31.7 g/dL (ref 30.0–36.0)
MCV: 94.4 fL (ref 80.0–100.0)
Platelets: 184 10*3/uL (ref 150–400)
RBC: 4.45 MIL/uL (ref 4.22–5.81)
RDW: 13.2 % (ref 11.5–15.5)
WBC: 9.6 10*3/uL (ref 4.0–10.5)
nRBC: 0 % (ref 0.0–0.2)

## 2020-06-12 LAB — COMPREHENSIVE METABOLIC PANEL
ALT: 28 U/L (ref 0–44)
AST: 25 U/L (ref 15–41)
Albumin: 3.6 g/dL (ref 3.5–5.0)
Alkaline Phosphatase: 59 U/L (ref 38–126)
Anion gap: 7 (ref 5–15)
BUN: 29 mg/dL — ABNORMAL HIGH (ref 8–23)
CO2: 23 mmol/L (ref 22–32)
Calcium: 9 mg/dL (ref 8.9–10.3)
Chloride: 111 mmol/L (ref 98–111)
Creatinine, Ser: 1.74 mg/dL — ABNORMAL HIGH (ref 0.61–1.24)
GFR calc Af Amer: 41 mL/min — ABNORMAL LOW (ref >60)
GFR calc non Af Amer: 36 mL/min — ABNORMAL LOW (ref >60)
Glucose, Bld: 118 mg/dL — ABNORMAL HIGH (ref 70–99)
Potassium: 4.5 mmol/L (ref 3.5–5.1)
Sodium: 141 mmol/L (ref 135–145)
Total Bilirubin: 0.8 mg/dL (ref 0.3–1.2)
Total Protein: 7 g/dL (ref 6.5–8.1)

## 2020-06-12 LAB — SARS CORONAVIRUS 2 (TAT 6-24 HRS): SARS Coronavirus 2: NEGATIVE

## 2020-06-12 NOTE — Progress Notes (Addendum)
PCP - Jonny Ruiz McFaddin  [facility] Cardiologist - Ginger Carne [facility] Cardiology Calla Kicks PA-[facility] in [place]--pacemaker  PPM/ICD - pacemaker Device Orders - faxed Rep Notified - Kipp Brood from [employer] ( dual chamber pacemaker)  Chest x-ray - na EKG - 01/20/20 Stress Test -  ECHO -  Cardiac Cath -   Sleep Study - yes CPAP -  Cannot tolerate  CBG 118 on lab work today  Fasting Blood Sugar - 92-120 Checks Blood Sugar ___1__ times a week  Blood Thinner Instructions: Aspirin Instructions: stopped7/22  ERAS Protcol -na   COVID TEST-  06/12/20 Anesthesia review:  Pacemaker/cardiac hx.  Patient denies shortness of breath, fever, cough and chest pain at PAT appointment   All instructions explained to the patient, with a verbal understanding of the material. Patient agrees to go over the instructions while at home for a better understanding. Patient also instructed to self quarantine after being tested for COVID-19. The opportunity to ask questions was provided.

## 2020-06-13 NOTE — Progress Notes (Signed)
Anesthesia Chart Review:  Case: [id number] Date/Time: 06/14/20 1015   Procedure: IMPLANTATION OF HYPOGLOSSAL NERVE STIMULATOR (Right )   Anesthesia type: General   Pre-op diagnosis: snoring   [facility] ROOM 09 / [facility] OR   Surgeons: Christia Reading, MD      DISCUSSION: Patient is an 84 year old male scheduled for the above procedure. S/p drug induced endoscopy 05/03/20--He was cleared for that procedure by his cardiologist Dr. Desma Maxim (scanned under Media tab).  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 Labs trends over the past year show Creatinine mostly in the 1.4-1.50 range.   REMOTE PPM MONITORING ASSESSMENT 04/04/20 [facility] Care Everywhere) Manufacturer of Device: [employer] Type of Device: Dual Chamber Pacemaker Presenting Rhythm: AP V@ 70bpm Percentage RV / BiV Pacing: 5%RV Pacing Device Findings: No events Congestive Heart Failure Surveillance: No fluid Plan: Will continue remote monitoring.   PAT RN notified [employer] Rep Kipp Brood) of patient's upcoming procedure: Hypoglossal nerve stimulator implant with Dr. Jenne Pane at 10:30 on 06/14/20. Perioperative Rx form is still pending from Dr. Jerrell Mylar office.  He denied shortness of breath, cough, fever, chest pain at PAT RN visit.  Preoperative labs on 06/12/20 showed Cr 1.74, BUN 29. Review of lab trends in [facility] Care Everywhere, show Creatinine has been ~ 1.40-1.50 range since 07/29/19. Most recent results noted:  05/01/20: BUN 25, Cr 1.24 03/15/20: BUN 24, Cr 1.42 [facility] CE) 02/28/20: BUN 28, Cr 1.50 [facility] CE) 01/21/20: BUN 42. Cr 1.88 - At 05/30/20 follow-up with PCP Dr. Cyndia Diver, he notes elevated  Creatinine, recommended stay hydrated and repeat in 3 months..   Last aspirin 06/08/2020.  06/12/2020 presurgical Covid test negative.  Anesthesia team to evaluate on the day of surgery.   VS: BP 125/75   Pulse 77   Temp 36.9 C (Oral)   Resp 18   Ht 5\' 6"  (1.676 m)   Wt 82.2 kg   SpO2 97%   BMI 29.23 kg/m    PROVIDERS: ., MD his PCP [facility]). Last visit 05/30/20.  06/01/20, MD is cardiologist. Last visit 08/04/19 [facility] Cardiology, see Care Everywhere). Continue same meds, referred for OSA re-evaluation, and ~ 8 month follow-up planned.  [facility], MD is EP cardiologist. Often seen Sandy Salaam in the [facility] office. [facility], MD is neurologist [facility] Care Everywhere) [facility], MD is IR (renal mass ablation). Last visit 02/15/20.  Repeat CT in 1 year.   LABS: Preoperative labs noted. See DISCUSSION. A1c 5.8% on 05/26/20 [facility] CE). (all labs ordered are listed, but only abnormal results are displayed)  Labs Reviewed  COMPREHENSIVE METABOLIC PANEL - Abnormal; Notable for the following components:      Result Value   Glucose, Bld 118 (*)    BUN 29 (*)    Creatinine, Ser 1.74 (*)    GFR calc non Af Amer 36 (*)    GFR calc Af Amer 41 (*)    All other components within normal limits  CBC     EKG: 01/20/20: Atrial-paced rhythm with prolonged AV conduction Abnormal ECG Confirmed by 03/21/20 [phone]) on 01/20/2020 6:15:49 PM   CV: Nuclear stress test 01/05/19 [facility]  CE): Summary  There is normal isotope uptake following Lexiscan injection and at rest.  There is no evidence of ischemia.  Normal LV function with a calculated EF of 62 %.   Echo 12/09/18 [facility] CE): Summary  NPS  Trace mitral regurgitation. Tricuspid valve is structurally normal.  No evidence of tricuspid stenosis.  Trace tricuspid regurgitation.  RVSP = 24.5 mmHg.  Normal left ventricular size and  systolic function with no appreciable  segmental abnormality.  Ejection fraction is visually estimated at 60-65%.  Diastolic function appears abnormal.  Mild concentric left ventricular hypertrophy.  Pacer and/or defibrillator wires visualized in right atrium.  Mildly reduced right ventricular systolic function (TAPSE 1.75 and RV S  velocity 8.59 cm/s).  Pacer and/or defibrillator wires visualized in right ventricle.  No evidence of pericardial effusion. The aortic root diameter is within normal limits.  The ascending aorta diameter is within normal limits.    Past Medical History:  Diagnosis Date  . Arthritis    hands  . Chronic back pain   . Coronary artery disease 1991   CABG at [facility]  . Diabetes mellitus without complication (HCC)   . Hepatitis    not sure which one  . Hypertension   . Memory deficits   . Presence of permanent cardiac pacemaker    SSS  . Sleep apnea    did not tolerate CPAP  . Spondylosis of lumbar spine   . Stroke [facility])    (post CABG per patient's record)  . Tremor     Past Surgical History:  Procedure Laterality Date  . CORONARY ARTERY BYPASS GRAFT  1991  . DRUG INDUCED ENDOSCOPY N/A 05/03/2020   Procedure: DRUG INDUCED ENDOSCOPY;  Surgeon: Christia Reading, MD;  Location: [facility];  Service: ENT;  Laterality: N/A;  . IR RADIOLOGIST EVAL & MGMT  07/28/2019  . IR RADIOLOGIST EVAL & MGMT  10/28/2019  . IR RADIOLOGIST EVAL & MGMT  02/15/2020  . LAPAROSCOPIC ABLATION RENAL MASS Right   . PACEMAKER INSERTION      MEDICATIONS: . aspirin EC 81 MG tablet  . atorvastatin (LIPITOR) 80 MG tablet  . Cyanocobalamin (RA VITAMIN B12) 2000 MCG TBCR  . donepezil (ARICEPT) 10 MG tablet  . ferrous sulfate 325 (65 FE) MG tablet  . Glucosamine HCl 1500 MG TABS  . memantine (NAMENDA) 10 MG tablet  . metoprolol succinate (TOPROL-XL) 50 MG 24 hr tablet  . Multiple Vitamin (MULTIVITAMIN WITH MINERALS) TABS tablet  . Multiple Vitamins-Minerals (ICAPS  AREDS 2 PO)  . Omega-3 Fatty Acids (FISH OIL) 1200 MG CAPS  . potassium chloride (KLOR-CON) 10 MEQ tablet  . silodosin (RAPAFLO) 4 MG CAPS capsule  . telmisartan (MICARDIS) 80 MG tablet  . tolterodine (DETROL LA) 4 MG 24 hr capsule   No current facility-administered medications for this encounter.    Shonna Chock, PA-C Surgical Short Stay/Anesthesiology [facility] Phone [phone] [facility] Phone [phone] 06/13/2020 12:32 PM

## 2020-06-13 NOTE — Anesthesia Preprocedure Evaluation (Addendum)
Anesthesia Evaluation  Patient identified by MRN, date of birth, ID band Patient awake    Reviewed: Allergy & Precautions, NPO status , Patient's Chart, lab work & pertinent test results, reviewed documented beta blocker date and time   Airway Mallampati: II  TM Distance: >3 FB Neck ROM: Full    Dental  (+) Teeth Intact, Dental Advisory Given, Caps,    Pulmonary sleep apnea , former smoker,    Pulmonary exam normal breath sounds clear to auscultation       Cardiovascular hypertension, Pt. on home beta blockers and Pt. on medications (-) angina+ CAD and + CABG  (-) Cardiac Stents Normal cardiovascular exam+ pacemaker  Rhythm:Regular Rate:Normal     Neuro/Psych CVA, Residual Symptoms negative psych ROS   GI/Hepatic negative GI ROS, Neg liver ROS,   Endo/Other  diabetes, Well Controlled, Type 2  Renal/GU Renal InsufficiencyRenal disease     Musculoskeletal  (+) Arthritis ,   Abdominal   Peds  Hematology negative hematology ROS (+)   Anesthesia Other Findings Day of surgery medications reviewed with the patient.  Reproductive/Obstetrics                            Anesthesia Physical Anesthesia Plan  ASA: III  Anesthesia Plan: General   Post-op Pain Management:    Induction: Intravenous  PONV Risk Score and Plan: 3 and Ondansetron and Dexamethasone  Airway Management Planned: Oral ETT  Additional Equipment:   Intra-op Plan:   Post-operative Plan: Extubation in OR  Informed Consent: I have reviewed the patients History and Physical, chart, labs and discussed the procedure including the risks, benefits and alternatives for the proposed anesthesia with the patient or authorized representative who has indicated his/her understanding and acceptance.     Dental advisory given  Plan Discussed with: CRNA  Anesthesia Plan Comments: (PAT note written 06/13/2020 by Shonna Chock, PA-C.  Baseline Creatinine over the past year ~ 1.40-1.50. Has [employer] PPM.   Have magnet available. PPM with noise detection, underlying sinus bradycardia rhythm.)      Anesthesia Quick Evaluation

## 2020-06-14 ENCOUNTER — Other Ambulatory Visit: Payer: Self-pay

## 2020-06-14 ENCOUNTER — Ambulatory Visit (HOSPITAL_COMMUNITY): Payer: Medicare Other | Admitting: Anesthesiology

## 2020-06-14 ENCOUNTER — Ambulatory Visit (HOSPITAL_COMMUNITY): Payer: Medicare Other

## 2020-06-14 ENCOUNTER — Ambulatory Visit (HOSPITAL_COMMUNITY): Payer: Medicare Other | Admitting: Vascular Surgery

## 2020-06-14 ENCOUNTER — Encounter (HOSPITAL_COMMUNITY)
Admission: RE | Disposition: A | Discharge status: Home / Self Care | Payer: Self-pay | Source: Home / Self Care | Attending: Otolaryngology

## 2020-06-14 ENCOUNTER — Encounter (HOSPITAL_COMMUNITY): Payer: Self-pay | Admitting: Otolaryngology

## 2020-06-14 ENCOUNTER — Observation Stay (HOSPITAL_COMMUNITY)
Admission: RE | Admit: 2020-06-14 | Discharge: 2020-06-15 | Disposition: A | Discharge status: Home / Self Care | Payer: Medicare Other | Attending: Otolaryngology | Admitting: Otolaryngology

## 2020-06-14 DIAGNOSIS — G4733 Obstructive sleep apnea (adult) (pediatric): Principal | ICD-10-CM | POA: Diagnosis present

## 2020-06-14 DIAGNOSIS — Z7984 Long term (current) use of oral hypoglycemic drugs: Secondary | ICD-10-CM | POA: Insufficient documentation

## 2020-06-14 DIAGNOSIS — I2510 Atherosclerotic heart disease of native coronary artery without angina pectoris: Secondary | ICD-10-CM | POA: Insufficient documentation

## 2020-06-14 DIAGNOSIS — I119 Hypertensive heart disease without heart failure: Secondary | ICD-10-CM | POA: Diagnosis not present

## 2020-06-14 DIAGNOSIS — E119 Type 2 diabetes mellitus without complications: Secondary | ICD-10-CM | POA: Diagnosis not present

## 2020-06-14 DIAGNOSIS — Z950 Presence of cardiac pacemaker: Secondary | ICD-10-CM | POA: Diagnosis not present

## 2020-06-14 DIAGNOSIS — Z87891 Personal history of nicotine dependence: Secondary | ICD-10-CM | POA: Insufficient documentation

## 2020-06-14 DIAGNOSIS — Z7982 Long term (current) use of aspirin: Secondary | ICD-10-CM | POA: Diagnosis not present

## 2020-06-14 DIAGNOSIS — Z79899 Other long term (current) drug therapy: Secondary | ICD-10-CM | POA: Diagnosis not present

## 2020-06-14 DIAGNOSIS — Z6829 Body mass index (BMI) 29.0-29.9, adult: Secondary | ICD-10-CM | POA: Diagnosis not present

## 2020-06-14 HISTORY — PX: IMPLANTATION OF HYPOGLOSSAL NERVE STIMULATOR: SHX6827

## 2020-06-14 LAB — GLUCOSE, CAPILLARY
Glucose-Capillary: 118 mg/dL — ABNORMAL HIGH (ref 70–99)
Glucose-Capillary: 118 mg/dL — ABNORMAL HIGH (ref 70–99)
Glucose-Capillary: 154 mg/dL — ABNORMAL HIGH (ref 70–99)
Glucose-Capillary: 223 mg/dL — ABNORMAL HIGH (ref 70–99)

## 2020-06-14 SURGERY — INSERTION, HYPOGLOSSAL NERVE STIMULATOR
Anesthesia: General | Site: Chest | Laterality: Right

## 2020-06-14 MED ORDER — FESOTERODINE FUMARATE ER 8 MG PO TB24
8.0000 mg | ORAL_TABLET | Freq: Every day | ORAL | Status: DC
  Filled 2020-06-14: qty 1

## 2020-06-14 MED ORDER — MEMANTINE HCL 10 MG PO TABS
10.0000 mg | ORAL_TABLET | Freq: Two times a day (BID) | ORAL | Status: DC
  Administered 2020-06-14: 10 mg via ORAL
  Filled 2020-06-14 (×2): qty 1

## 2020-06-14 MED ORDER — OMEGA-3-ACID ETHYL ESTERS 1 G PO CAPS
1.0000 g | ORAL_CAPSULE | Freq: Every day | ORAL | Status: DC
  Administered 2020-06-14: 1 g via ORAL
  Filled 2020-06-14 (×2): qty 1

## 2020-06-14 MED ORDER — VITAMIN B-12 1000 MCG PO TABS
2000.0000 ug | ORAL_TABLET | Freq: Every day | ORAL | Status: DC
  Filled 2020-06-14: qty 2

## 2020-06-14 MED ORDER — ORAL CARE MOUTH RINSE: 15.0000 mL | Freq: Once | OROMUCOSAL | Status: AC

## 2020-06-14 MED ORDER — LACTATED RINGERS IV SOLN: INTRAVENOUS | Status: DC

## 2020-06-14 MED ORDER — MORPHINE SULFATE (PF) 2 MG/ML IV SOLN: 2.0000 mg | INTRAVENOUS | Status: DC | PRN

## 2020-06-14 MED ORDER — DONEPEZIL HCL 10 MG PO TABS
10.0000 mg | ORAL_TABLET | Freq: Two times a day (BID) | ORAL | Status: DC
  Administered 2020-06-14 – 2020-06-15 (×2): 10 mg via ORAL
  Filled 2020-06-14 (×2): qty 1

## 2020-06-14 MED ORDER — ONDANSETRON HCL 4 MG/2ML IJ SOLN: 4.0000 mg | Freq: Once | INTRAMUSCULAR | Status: DC | PRN

## 2020-06-14 MED ORDER — ADULT MULTIVITAMIN W/MINERALS CH
1.0000 | ORAL_TABLET | Freq: Every day | ORAL | Status: DC
  Filled 2020-06-14: qty 1

## 2020-06-14 MED ORDER — TAMSULOSIN HCL 0.4 MG PO CAPS
0.4000 mg | ORAL_CAPSULE | Freq: Every day | ORAL | Status: DC
  Administered 2020-06-14: 0.4 mg via ORAL
  Filled 2020-06-14: qty 1

## 2020-06-14 MED ORDER — ATORVASTATIN CALCIUM 80 MG PO TABS
80.0000 mg | ORAL_TABLET | Freq: Every day | ORAL | Status: DC
  Administered 2020-06-14: 80 mg via ORAL
  Filled 2020-06-14: qty 1

## 2020-06-14 MED ORDER — CHLORHEXIDINE GLUCONATE 0.12 % MT SOLN: 15.0000 mL | Freq: Once | OROMUCOSAL | Status: AC

## 2020-06-14 MED ORDER — CHLORHEXIDINE GLUCONATE 0.12 % MT SOLN
OROMUCOSAL | Status: AC
  Administered 2020-06-14: 15 mL via OROMUCOSAL
  Filled 2020-06-14: qty 15

## 2020-06-14 MED ORDER — FENTANYL CITRATE (PF) 100 MCG/2ML IJ SOLN
INTRAMUSCULAR | Status: AC
  Filled 2020-06-14: qty 2

## 2020-06-14 MED ORDER — PHENYLEPHRINE HCL-NACL 10-0.9 MG/250ML-% IV SOLN
INTRAVENOUS | Status: DC | PRN
  Administered 2020-06-14: 50 ug/min via INTRAVENOUS

## 2020-06-14 MED ORDER — STERILE WATER FOR IRRIGATION IR SOLN
Status: DC | PRN
  Administered 2020-06-14: 1000 mL

## 2020-06-14 MED ORDER — ONDANSETRON HCL 4 MG/2ML IJ SOLN: 4.0000 mg | INTRAMUSCULAR | Status: DC | PRN

## 2020-06-14 MED ORDER — SUCCINYLCHOLINE CHLORIDE 20 MG/ML IJ SOLN
INTRAMUSCULAR | Status: DC | PRN
  Administered 2020-06-14: 100 mg via INTRAVENOUS

## 2020-06-14 MED ORDER — FENTANYL CITRATE (PF) 100 MCG/2ML IJ SOLN
25.0000 ug | INTRAMUSCULAR | Status: DC | PRN
  Administered 2020-06-14 (×3): 25 ug via INTRAVENOUS

## 2020-06-14 MED ORDER — CLINDAMYCIN PHOSPHATE 900 MG/50ML IV SOLN
900.0000 mg | INTRAVENOUS | Status: AC
  Administered 2020-06-14: 900 mg via INTRAVENOUS

## 2020-06-14 MED ORDER — KCL IN DEXTROSE-NACL 20-5-0.45 MEQ/L-%-% IV SOLN
INTRAVENOUS | Status: DC
  Filled 2020-06-14: qty 1000

## 2020-06-14 MED ORDER — FERROUS SULFATE 325 (65 FE) MG PO TABS
325.0000 mg | ORAL_TABLET | Freq: Every day | ORAL | Status: DC
  Administered 2020-06-15: 325 mg via ORAL
  Filled 2020-06-14: qty 1

## 2020-06-14 MED ORDER — ONDANSETRON HCL 4 MG/2ML IJ SOLN
INTRAMUSCULAR | Status: DC | PRN
Start: 2020-06-14 — End: 2020-06-14
  Administered 2020-06-14: 4 mg via INTRAVENOUS

## 2020-06-14 MED ORDER — DEXMEDETOMIDINE (PRECEDEX) IN NS 20 MCG/5ML (4 MCG/ML) IV SYRINGE
PREFILLED_SYRINGE | INTRAVENOUS | Status: DC | PRN
  Administered 2020-06-14: 8 ug via INTRAVENOUS
  Administered 2020-06-14 (×2): 4 ug via INTRAVENOUS

## 2020-06-14 MED ORDER — 0.9 % SODIUM CHLORIDE (POUR BTL) OPTIME
TOPICAL | Status: DC | PRN
  Administered 2020-06-14: 1000 mL

## 2020-06-14 MED ORDER — FENTANYL CITRATE (PF) 250 MCG/5ML IJ SOLN
INTRAMUSCULAR | Status: DC | PRN
  Administered 2020-06-14: 50 ug via INTRAVENOUS
  Administered 2020-06-14 (×2): 100 ug via INTRAVENOUS

## 2020-06-14 MED ORDER — LIDOCAINE-EPINEPHRINE 1 %-1:100000 IJ SOLN
INTRAMUSCULAR | Status: DC | PRN
  Administered 2020-06-14: 4 mL

## 2020-06-14 MED ORDER — PROPOFOL 10 MG/ML IV BOLUS
INTRAVENOUS | Status: DC | PRN
  Administered 2020-06-14: 70 mg via INTRAVENOUS

## 2020-06-14 MED ORDER — SODIUM CHLORIDE 0.9 % IV SOLN
INTRAVENOUS | Status: AC
  Filled 2020-06-14: qty 500000

## 2020-06-14 MED ORDER — FENTANYL CITRATE (PF) 250 MCG/5ML IJ SOLN
INTRAMUSCULAR | Status: AC
  Filled 2020-06-14: qty 5

## 2020-06-14 MED ORDER — SODIUM CHLORIDE 0.9 % IV SOLN
INTRAVENOUS | Status: DC | PRN
  Administered 2020-06-14: 500 mL

## 2020-06-14 MED ORDER — HYDROCODONE-ACETAMINOPHEN 5-325 MG PO TABS: 1.0000 | ORAL_TABLET | Freq: Four times a day (QID) | ORAL | 0 refills | Status: DC | PRN

## 2020-06-14 MED ORDER — ACETAMINOPHEN 500 MG PO TABS: 1000.0000 mg | ORAL_TABLET | Freq: Once | ORAL | Status: AC

## 2020-06-14 MED ORDER — HYDROCODONE-ACETAMINOPHEN 5-325 MG PO TABS
1.0000 | ORAL_TABLET | ORAL | Status: DC | PRN
  Administered 2020-06-15: 1 via ORAL
  Filled 2020-06-14: qty 1

## 2020-06-14 MED ORDER — ACETAMINOPHEN 500 MG PO TABS
ORAL_TABLET | ORAL | Status: AC
  Administered 2020-06-14: 1000 mg via ORAL
  Filled 2020-06-14: qty 2

## 2020-06-14 MED ORDER — EPHEDRINE SULFATE 50 MG/ML IJ SOLN
INTRAMUSCULAR | Status: DC | PRN
  Administered 2020-06-14: 7.5 mg via INTRAVENOUS

## 2020-06-14 MED ORDER — METOPROLOL SUCCINATE ER 25 MG PO TB24
50.0000 mg | ORAL_TABLET | Freq: Every day | ORAL | Status: DC
  Administered 2020-06-14: 50 mg via ORAL
  Filled 2020-06-14 (×2): qty 2

## 2020-06-14 MED ORDER — DEXAMETHASONE SODIUM PHOSPHATE 10 MG/ML IJ SOLN
INTRAMUSCULAR | Status: DC | PRN
  Administered 2020-06-14: 10 mg via INTRAVENOUS

## 2020-06-14 MED ORDER — ONDANSETRON HCL 4 MG PO TABS: 4.0000 mg | ORAL_TABLET | ORAL | Status: DC | PRN

## 2020-06-14 MED ORDER — LIDOCAINE HCL (CARDIAC) PF 100 MG/5ML IV SOSY
PREFILLED_SYRINGE | INTRAVENOUS | Status: DC | PRN
  Administered 2020-06-14: 80 mg via INTRATRACHEAL

## 2020-06-14 MED ORDER — IRBESARTAN 300 MG PO TABS
300.0000 mg | ORAL_TABLET | Freq: Every day | ORAL | Status: DC
  Filled 2020-06-14: qty 1

## 2020-06-14 MED ORDER — CLINDAMYCIN PHOSPHATE 900 MG/50ML IV SOLN
INTRAVENOUS | Status: AC
  Filled 2020-06-14: qty 50

## 2020-06-14 MED ORDER — GLUCOSAMINE HCL 1500 MG PO TABS: 1500.0000 mg | ORAL_TABLET | Freq: Two times a day (BID) | ORAL | Status: DC

## 2020-06-14 MED ORDER — POTASSIUM CHLORIDE ER 10 MEQ PO TBCR
10.0000 meq | EXTENDED_RELEASE_TABLET | Freq: Every day | ORAL | Status: DC
  Filled 2020-06-14 (×2): qty 1

## 2020-06-14 MED ORDER — LIDOCAINE-EPINEPHRINE 1 %-1:100000 IJ SOLN
INTRAMUSCULAR | Status: AC
  Filled 2020-06-14: qty 1

## 2020-06-14 MED ORDER — ASPIRIN EC 81 MG PO TBEC
81.0000 mg | DELAYED_RELEASE_TABLET | Freq: Every day | ORAL | Status: DC
  Filled 2020-06-14: qty 1

## 2020-06-14 SURGICAL SUPPLY — 65 items
BAG DECANTER FOR FLEXI CONT (MISCELLANEOUS) ×3 IMPLANT
BLADE CLIPPER SURG (BLADE) IMPLANT
BLADE SURG 15 STRL LF DISP TIS (BLADE) ×3 IMPLANT
BLADE SURG 15 STRL SS (BLADE) ×6
CANISTER SUCT 3000ML PPV (MISCELLANEOUS) ×3 IMPLANT
CORD BIPOLAR FORCEPS 12FT (ELECTRODE) ×3 IMPLANT
COVER PROBE W GEL 5X96 (DRAPES) ×3 IMPLANT
COVER SURGICAL LIGHT HANDLE (MISCELLANEOUS) ×3 IMPLANT
COVER WAND RF STERILE (DRAPES) ×3 IMPLANT
DERMABOND ADVANCED (GAUZE/BANDAGES/DRESSINGS) ×4
DERMABOND ADVANCED .7 DNX12 (GAUZE/BANDAGES/DRESSINGS) ×2 IMPLANT
DRAPE C-ARM 35X43 STRL (DRAPES) ×3 IMPLANT
DRAPE HEAD BAR (DRAPES) ×3 IMPLANT
DRAPE INCISE IOBAN 66X45 STRL (DRAPES) ×3 IMPLANT
DRAPE MICROSCOPE LEICA 54X105 (DRAPES) ×3 IMPLANT
DRAPE UTILITY XL STRL (DRAPES) IMPLANT
DRSG TEGADERM 4X4.75 (GAUZE/BANDAGES/DRESSINGS) ×9 IMPLANT
ELECT COATED BLADE 2.86 ST (ELECTRODE) ×3 IMPLANT
ELECT EMG 18 NIMS (NEUROSURGERY SUPPLIES) ×3
ELECT REM PT RETURN 9FT ADLT (ELECTROSURGICAL) ×3
ELECTRODE EMG 18 NIMS (NEUROSURGERY SUPPLIES) ×1 IMPLANT
ELECTRODE REM PT RTRN 9FT ADLT (ELECTROSURGICAL) ×1 IMPLANT
FORCEPS BIPOLAR SPETZLER 8 1.0 (NEUROSURGERY SUPPLIES) ×3 IMPLANT
GAUZE 4X4 16PLY RFD (DISPOSABLE) ×3 IMPLANT
GAUZE SPONGE 4X4 12PLY STRL (GAUZE/BANDAGES/DRESSINGS) ×3 IMPLANT
GENERATOR PULSE INSPIRE (Generator) ×3 IMPLANT
GLOVE BIO SURGEON STRL SZ 6.5 (GLOVE) IMPLANT
GLOVE BIO SURGEON STRL SZ7.5 (GLOVE) ×3 IMPLANT
GLOVE BIO SURGEONS STRL SZ 6.5 (GLOVE)
GOWN STRL REUS W/ TWL LRG LVL3 (GOWN DISPOSABLE) ×3 IMPLANT
GOWN STRL REUS W/TWL LRG LVL3 (GOWN DISPOSABLE) ×6
KIT BASIN OR (CUSTOM PROCEDURE TRAY) ×3 IMPLANT
KIT NEUROSTIMULATOR ACCESSORY (KITS) IMPLANT
KIT TURNOVER KIT B (KITS) ×3 IMPLANT
LEAD SENSING RESP INSPIRE (Lead) ×3 IMPLANT
LEAD SLEEP STIMULATION INSPIRE (Lead) ×3 IMPLANT
LOOP VESSEL MAXI BLUE (MISCELLANEOUS) ×3 IMPLANT
LOOP VESSEL MINI RED (MISCELLANEOUS) ×3 IMPLANT
MARKER SKIN DUAL TIP RULER LAB (MISCELLANEOUS) ×6 IMPLANT
NEEDLE HYPO 25GX1X1/2 BEV (NEEDLE) ×3 IMPLANT
NS IRRIG 1000ML POUR BTL (IV SOLUTION) ×3 IMPLANT
PAD ARMBOARD 7.5X6 YLW CONV (MISCELLANEOUS) ×3 IMPLANT
PASSER CATH 38CM DISP (INSTRUMENTS) ×3 IMPLANT
PENCIL SMOKE EVACUATOR (MISCELLANEOUS) ×3 IMPLANT
POSITIONER HEAD DONUT 9IN (MISCELLANEOUS) ×3 IMPLANT
PROBE NERVE STIMULATOR (NEUROSURGERY SUPPLIES) ×3 IMPLANT
REMOTE CONTROL SLEEP INSPIRE (MISCELLANEOUS) ×3 IMPLANT
SET WALTER ACTIVATION W/DRAPE (SET/KITS/TRAYS/PACK) ×3 IMPLANT
SLING ARM FOAM STRAP LRG (SOFTGOODS) IMPLANT
SLING ARM FOAM STRAP MED (SOFTGOODS) IMPLANT
SPONGE INTESTINAL PEANUT (DISPOSABLE) ×3 IMPLANT
STAPLER VISISTAT 35W (STAPLE) ×3 IMPLANT
SUT SILK 2 0 SH (SUTURE) ×3 IMPLANT
SUT SILK 3 0 REEL (SUTURE) ×3 IMPLANT
SUT SILK 3 0 SH 30 (SUTURE) ×6 IMPLANT
SUT SILK 3-0 (SUTURE) ×2
SUT SILK 3-0 RB1 30XBRD (SUTURE) ×1
SUT VIC AB 3-0 SH 27 (SUTURE) ×4
SUT VIC AB 3-0 SH 27X BRD (SUTURE) ×2 IMPLANT
SUT VIC AB 4-0 PS2 27 (SUTURE) ×6 IMPLANT
SUTURE SILK 3-0 RB1 30XBRD (SUTURE) ×1 IMPLANT
SYR 10ML LL (SYRINGE) ×3 IMPLANT
TAPE CLOTH SURG 4X10 WHT LF (GAUZE/BANDAGES/DRESSINGS) ×3 IMPLANT
TOWEL GREEN STERILE (TOWEL DISPOSABLE) ×3 IMPLANT
TRAY ENT MC OR (CUSTOM PROCEDURE TRAY) ×3 IMPLANT

## 2020-06-14 NOTE — H&P (Signed)
Jake Woods is an 84 y.o. male.   Chief Complaint: Obstructive sleep apnea HPI: 84 year old male with obstructive sleep apnea who has not been able to tolerate CPAP.  He presents for hypoglossal nerve stimulator placement.  Past Medical History:  Diagnosis Date  . Arthritis    hands  . Chronic back pain   . Coronary artery disease 1991   CABG at [facility]  . Diabetes mellitus without complication (HCC)   . Hepatitis    not sure which one  . Hypertension   . Memory deficits   . Presence of permanent cardiac pacemaker    SSS  . Sleep apnea    did not tolerate CPAP  . Spondylosis of lumbar spine   . Stroke [facility])    (post CABG per patient's record)  . Tremor     Past Surgical History:  Procedure Laterality Date  . CORONARY ARTERY BYPASS GRAFT  1991  . DRUG INDUCED ENDOSCOPY N/A 05/03/2020   Procedure: DRUG INDUCED ENDOSCOPY;  Surgeon: Christia Reading, MD;  Location: [facility];  Service: ENT;  Laterality: N/A;  . IR RADIOLOGIST EVAL & MGMT  07/28/2019  . IR RADIOLOGIST EVAL & MGMT  10/28/2019  . IR RADIOLOGIST EVAL & MGMT  02/15/2020  . LAPAROSCOPIC ABLATION RENAL MASS Right   . PACEMAKER INSERTION      History reviewed. No pertinent family history. Social History:  reports that he quit smoking about 37 years ago. He has a 90.00 pack-year smoking history. He has never used smokeless tobacco. He reports previous alcohol use. He reports that he does not use drugs.  Allergies:  Allergies  Allergen Reactions  . Ambien [Zolpidem] Other (See Comments)    confusion  . Viagra [Sildenafil] Other (See Comments)    flushing  . Penicillins Rash and Other (See Comments)    Welts.    Medications Prior to Admission  Medication Sig Dispense Refill  . aspirin EC 81 MG tablet Take 81 mg by mouth daily.    Marland Kitchen atorvastatin (LIPITOR) 80 MG tablet Take 80 mg by mouth at bedtime.     . Cyanocobalamin (RA VITAMIN B12) 2000 MCG TBCR Take 2,000 mcg by mouth daily.    Marland Kitchen donepezil (ARICEPT)  10 MG tablet Take 10 mg by mouth in the morning and at bedtime.     . ferrous sulfate 325 (65 FE) MG tablet Take 325 mg by mouth daily with breakfast.    . Glucosamine HCl 1500 MG TABS Take 1,500 mg by mouth in the morning and at bedtime.    . memantine (NAMENDA) 10 MG tablet Take 10 mg by mouth 2 (two) times daily.    . metoprolol succinate (TOPROL-XL) 50 MG 24 hr tablet Take 50 mg by mouth daily. Take with or immediately following a meal.    . Multiple Vitamin (MULTIVITAMIN WITH MINERALS) TABS tablet Take 1 tablet by mouth daily. Chewable    . Multiple Vitamins-Minerals (ICAPS AREDS 2 PO) Take 1 tablet by mouth in the morning and at bedtime.    . Omega-3 Fatty Acids (FISH OIL) 1200 MG CAPS Take 1,200 mg by mouth in the morning and at bedtime.    . potassium chloride (KLOR-CON) 10 MEQ tablet Take 10 mEq by mouth daily.    . silodosin (RAPAFLO) 4 MG CAPS capsule Take 4 mg by mouth at bedtime.     Marland Kitchen telmisartan (MICARDIS) 80 MG tablet Take 80 mg by mouth daily.    Marland Kitchen tolterodine (DETROL LA)  4 MG 24 hr capsule Take 4 mg by mouth daily.      Results for orders placed or performed during the hospital encounter of 06/14/20 (from the past 48 hour(s))  Glucose, capillary     Status: Abnormal   Collection Time: 06/14/20  8:38 AM  Result Value Ref Range   Glucose-Capillary 118 (H) 70 - 99 mg/dL    Comment: Glucose reference range applies only to samples taken after fasting for at least 8 hours.   No results found.  Review of Systems  All other systems reviewed and are negative.   Blood pressure (!) 134/73, pulse 85, temperature 98 F (36.7 C), temperature source Oral, resp. rate 17, height 5\' 6"  (1.676 m), weight 82.2 kg, SpO2 96 %. Physical Exam Constitutional:      Appearance: Normal appearance. He is normal weight.  HENT:     Head: Normocephalic and atraumatic.     Right Ear: External ear normal.     Left Ear: External ear normal.     Nose: Nose normal.     Mouth/Throat:     Mouth:  Mucous membranes are moist.     Pharynx: Oropharynx is clear.  Eyes:     Extraocular Movements: Extraocular movements intact.     Conjunctiva/sclera: Conjunctivae normal.     Pupils: Pupils are equal, round, and reactive to light.  Cardiovascular:     Rate and Rhythm: Normal rate.  Pulmonary:     Effort: Pulmonary effort is normal.  Skin:    General: Skin is warm.  Neurological:     General: No focal deficit present.     Mental Status: He is alert and oriented to person, place, and time.  Psychiatric:        Mood and Affect: Mood normal.        Behavior: Behavior normal.        Thought Content: Thought content normal.        Judgment: Judgment normal.      Assessment/Plan Obstructive sleep apnea, BMI 29  To OR for hypoglossal nerve stimulator placement.  , MD 06/14/2020, 10:26 AM

## 2020-06-14 NOTE — Op Note (Signed)

## 2020-06-14 NOTE — Anesthesia Procedure Notes (Signed)
Procedure Name: Intubation Date/Time: 06/14/2020 10:47 AM Performed by: Marena Chancy, CRNA Pre-anesthesia Checklist: Patient identified, Emergency Drugs available, Suction available and Patient being monitored Patient Re-evaluated:Patient Re-evaluated prior to induction Oxygen Delivery Method: Circle System Utilized Preoxygenation: Pre-oxygenation with 100% oxygen Induction Type: IV induction Ventilation: Mask ventilation without difficulty Laryngoscope Size: Miller and 2 Grade View: Grade I Tube type: Oral Tube size: 7.0 mm Number of attempts: 1 Airway Equipment and Method: Stylet and Oral airway Placement Confirmation: ETT inserted through vocal cords under direct vision,  positive ETCO2 and breath sounds checked- equal and bilateral Tube secured with: Tape Dental Injury: Teeth and Oropharynx as per pre-operative assessment

## 2020-06-14 NOTE — Brief Op Note (Signed)
06/14/2020  12:36 PM  PATIENT:  Jake Woods  84 y.o. male  PRE-OPERATIVE DIAGNOSIS:  Obstructive sleep apnea  POST-OPERATIVE DIAGNOSIS:  Same  PROCEDURE:  Procedure(s): IMPLANTATION OF HYPOGLOSSAL NERVE STIMULATOR (Right)  SURGEON:  Surgeon(s) and Role:    Christia Reading, MD - Primary  PHYSICIAN ASSISTANT:   ASSISTANTS: none   ANESTHESIA:   general  EBL:  20 mL   BLOOD ADMINISTERED:none  DRAINS: none   LOCAL MEDICATIONS USED:  LIDOCAINE   SPECIMEN:  No Specimen  DISPOSITION OF SPECIMEN:  N/A  COUNTS:  YES  TOURNIQUET:  * No tourniquets in log *  DICTATION: .Note written in EPIC  PLAN OF CARE: Discharge to home after PACU  PATIENT DISPOSITION:  PACU - hemodynamically stable.   Delay start of Pharmacological VTE agent (>24hrs) due to surgical blood loss or risk of bleeding: no

## 2020-06-14 NOTE — Progress Notes (Signed)
   Subjective:    Patient ID: Jake Woods, male    DOB: 03/26/1936, 84 y.o.   MRN: [id number]  HPI He is feeling good with very little discomfort.  He was able to swallow well.  Review of Systems     Objective:   Physical Exam AF VSS Alert, NAD Normal tongue movement Right neck and chest dressings in place    Assessment & Plan:  Obstructive sleep apnea  He is doing well following hypoglossal nerve stimulator placement.  Will observe overnight given his age and medical problems.

## 2020-06-14 NOTE — Transfer of Care (Signed)
Immediate Anesthesia Transfer of Care Note  Patient: Jake Woods  Procedure(s) Performed: IMPLANTATION OF HYPOGLOSSAL NERVE STIMULATOR (Right Chest)  Patient Location: PACU  Anesthesia Type:General  Level of Consciousness: awake, alert  and oriented  Airway & Oxygen Therapy: Patient Spontanous Breathing and Patient connected to face mask oxygen  Post-op Assessment: Report given to RN, Post -op Vital signs reviewed and stable and Patient moving all extremities X 4  Post vital signs: Reviewed and stable  Last Vitals:  Vitals Value Taken Time  BP    Temp    Pulse 87 06/14/20 1257  Resp 10 06/14/20 1257  SpO2 96 % 06/14/20 1257  Vitals shown include unvalidated device data.  Last Pain:  Vitals:   06/14/20 0933  TempSrc:   PainSc: 0-No pain      Patients Stated Pain Goal: 3 (06/14/20 0933)  Complications: No complications documented.

## 2020-06-14 NOTE — Anesthesia Postprocedure Evaluation (Signed)
Anesthesia Post Note  Patient: Jake Woods  Procedure(s) Performed: IMPLANTATION OF HYPOGLOSSAL NERVE STIMULATOR (Right Chest)     Patient location during evaluation: PACU Anesthesia Type: General Level of consciousness: awake and alert and awake Pain management: pain level controlled Vital Signs Assessment: post-procedure vital signs reviewed and stable Respiratory status: spontaneous breathing, nonlabored ventilation, respiratory function stable and patient connected to nasal cannula oxygen Cardiovascular status: blood pressure returned to baseline and stable Postop Assessment: no apparent nausea or vomiting Anesthetic complications: no   No complications documented.  Last Vitals:  Vitals:   06/14/20 1633 06/14/20 1638  BP:  (!) 153/99  Pulse: 86 85  Resp: (!) 27 (!) 11  Temp:    SpO2: 95% 94%    Last Pain:  Vitals:   06/14/20 1638  TempSrc:   PainSc: Asleep                 Cecile Hearing

## 2020-06-15 DIAGNOSIS — G4733 Obstructive sleep apnea (adult) (pediatric): Secondary | ICD-10-CM | POA: Diagnosis not present

## 2020-06-15 LAB — GLUCOSE, CAPILLARY: Glucose-Capillary: 173 mg/dL — ABNORMAL HIGH (ref 70–99)

## 2020-06-15 NOTE — Plan of Care (Signed)

## 2020-06-15 NOTE — Discharge Summary (Signed)
Physician Discharge Summary  Patient ID: Jake Woods MRN: [id number] DOB/AGE: September 07, 1936 84 y.o.  Admit date: 06/14/2020 Discharge date: 06/15/2020  Admission Diagnoses: OSA  Discharge Diagnoses:  Active Problems:   Obstructive sleep apnea   Discharged Condition: good  Hospital Course: 84 year old male presented for hypoglossal nerve stimulator placement.  See operative note.  He was observed overnight due to slow wake-up and age.  He did well overnight and is felt stable for discharge on POD 1.  Consults: None  Significant Diagnostic Studies: None  Treatments: surgery: Hypoglossal nerve stimulator placement  Discharge Exam: Blood pressure 121/67, pulse 66, temperature 97.7 F (36.5 C), resp. rate 16, height 5\' 6"  (1.676 m), weight 82.2 kg, SpO2 97 %. General appearance: alert, cooperative and no distress Neck: right neck dressing removed, incision clean and intact with no fluid collection Chest wall: right chest dressing removed, incision clean and intact with no fluid collection  Disposition: Discharge disposition: 01-Home or Self Care       Discharge Instructions    Diet - low sodium heart healthy   Complete by: As directed    Discharge instructions   Complete by: As directed    Avoid strenuous activity.  Take dressings off tomorrow.  Then, OK to allow incisions to get wet, gently pat dry.  Do not apply ointment to the incisions.  Normal diet.   Discharge instructions   Complete by: As directed    OK to allow incisions to get wet, gently pat dry.  Avoid strenuous activity.   Increase activity slowly   Complete by: As directed    Increase activity slowly   Complete by: As directed    No wound care   Complete by: As directed      Allergies as of 06/15/2020      Reactions   Ambien [zolpidem] Other (See Comments)   confusion   Viagra [sildenafil] Other (See Comments)   flushing   Penicillins Rash, Other (See Comments)   Welts.      Medication List     TAKE these medications   aspirin EC 81 MG tablet Take 81 mg by mouth daily.   atorvastatin 80 MG tablet Commonly known as: LIPITOR Take 80 mg by mouth at bedtime.   donepezil 10 MG tablet Commonly known as: ARICEPT Take 10 mg by mouth in the morning and at bedtime.   ferrous sulfate 325 (65 FE) MG tablet Take 325 mg by mouth daily with breakfast.   Fish Oil 1200 MG Caps Take 1,200 mg by mouth in the morning and at bedtime.   Glucosamine HCl 1500 MG Tabs Take 1,500 mg by mouth in the morning and at bedtime.   HYDROcodone-acetaminophen 5-325 MG tablet Commonly known as: NORCO/VICODIN Take 1-2 tablets by mouth every 6 (six) hours as needed for moderate pain.   memantine 10 MG tablet Commonly known as: NAMENDA Take 10 mg by mouth 2 (two) times daily.   metoprolol succinate 50 MG 24 hr tablet Commonly known as: TOPROL-XL Take 50 mg by mouth daily. Take with or immediately following a meal.   multivitamin with minerals Tabs tablet Take 1 tablet by mouth daily. Chewable   potassium chloride 10 MEQ tablet Commonly known as: KLOR-CON Take 10 mEq by mouth daily.   RA Vitamin B12 2000 MCG Tbcr Generic drug: Cyanocobalamin Take 2,000 mcg by mouth daily.   silodosin 4 MG Caps capsule Commonly known as: RAPAFLO Take 4 mg by mouth at bedtime.   telmisartan 80 MG  tablet Commonly known as: MICARDIS Take 80 mg by mouth daily.   tolterodine 4 MG 24 hr capsule Commonly known as: DETROL LA Take 4 mg by mouth daily.       Follow-up Information    Christia Reading, MD. Schedule an appointment as soon as possible for a visit in 1 week.   Specialty: Otolaryngology Contact information: [address] [place] [address] [phone]               Signed: Christia Reading 06/15/2020, 8:04 AM

## 2020-06-16 ENCOUNTER — Encounter (HOSPITAL_COMMUNITY): Payer: Self-pay | Admitting: Otolaryngology

## 2020-07-26 ENCOUNTER — Encounter: Payer: Self-pay | Admitting: Neurology

## 2020-07-26 ENCOUNTER — Ambulatory Visit (INDEPENDENT_AMBULATORY_CARE_PROVIDER_SITE_OTHER): Payer: Medicare Other | Admitting: Neurology

## 2020-07-26 ENCOUNTER — Other Ambulatory Visit: Payer: Self-pay

## 2020-07-26 VITALS — BP 124/76 | HR 75 | Ht 65.0 in | Wt 179.0 lb

## 2020-07-26 DIAGNOSIS — G4733 Obstructive sleep apnea (adult) (pediatric): Secondary | ICD-10-CM | POA: Diagnosis not present

## 2020-07-26 DIAGNOSIS — Z789 Other specified health status: Secondary | ICD-10-CM | POA: Diagnosis not present

## 2020-07-26 NOTE — Progress Notes (Signed)
Subjective:    Patient ID: Jake Woods is a 84 y.o. male.  HPI     Jake Foley, MD, PhD [facility] [address] [address] [place], [place] [address]  Dear Dr. Jenne Woods,   I saw your patient, Jake Woods, upon your kind request, in my Sleep clinic today for initial consultation of his sleep disorder, in particular, evaluation of his sleep apnea with history of CPAP intolerance, status post recent placement of inspire hypoglossal nerve stimulator.  The patient is accompanied by his daughter, Jake Woods, today.  As you know, Jake Woods is an 84 year old right-handed gentleman with an underlying medical history of arthritis, chronic back pain, coronary artery disease with status post bypass in 1991, sick sinus syndrome with status post pacemaker placement some 15 years ago, history of stroke, tremor, memory loss, for which he sees neurology in [facility], and overweight state, who was diagnosed with obstructive sleep apnea but could not tolerate CPAP.  He had a sleep study in [facility] in March 2019 which showed an AHI of 27.2/h, O2 nadir of 78%. He reports that he tried CPAP for about a month.  He gave the machine back.  He has not been sleeping well.  His snoring bothers his wife but his wife's CPAP also bothers him.  He tries to be in bed around 10 PM, rise time is generally between 6 and 7.  He has 2 brothers with sleep apnea and 1 Woods with sleep apnea as well.  He does take naps during the day, stays in his recliner and falls asleep inadvertently.  They have 2 dogs and 1 cat in the household.  He drinks no daily caffeine, soda occasionally, tea occasionally.  He quit smoking some 35 years ago and drinks alcohol rarely.  He had inspire evaluation and I reviewed your records.  He had the implantation on 06/14/2020.  He has healed well.  He did have a fall the day after he was released home.  He had to get checked out in [facility] in the emergency room. I reviewed the  emergency room records from 06/15/2020.  He had a head CT and cervical spine CT without contrast and I reviewed the results: IMPRESSION: 1. Mild diffuse cortical atrophy. Mild chronic ischemic white matter disease. No acute intracranial abnormality seen. 2. Multilevel degenerative disc disease. No acute abnormality seen in the cervical spine.  He also had a chest x-ray without any acute cardiopulmonary disease seen.  His Epworth sleepiness score is 14 out of 24, fatigue severity score is 14 out of 63.  His Past Medical History Is Significant For: Past Medical History:  Diagnosis Date  . Arthritis    hands  . Chronic back pain   . Coronary artery disease 1991   CABG at [facility]  . Diabetes mellitus without complication (HCC)   . Hepatitis    not sure which one  . Hypertension   . Memory deficits   . Presence of permanent cardiac pacemaker    SSS  . Sleep apnea    did not tolerate CPAP  . Spondylosis of lumbar spine   . Stroke [facility])    (post CABG per patient's record)  . Tremor     His Past Surgical History Is Significant For: Past Surgical History:  Procedure Laterality Date  . CORONARY ARTERY BYPASS GRAFT  1991  . DRUG INDUCED ENDOSCOPY N/A 05/03/2020   Procedure: DRUG INDUCED ENDOSCOPY;  Surgeon: Christia Reading, MD;  Location: [facility];  Service: ENT;  Laterality: N/A;  . IMPLANTATION OF HYPOGLOSSAL NERVE STIMULATOR Right 06/14/2020   Procedure: IMPLANTATION OF HYPOGLOSSAL NERVE STIMULATOR;  Surgeon: Christia ReadingBates, Dwight, MD;  [facility];  Service: ENT;  Laterality: Right;  . IR RADIOLOGIST EVAL & MGMT  07/28/2019  . IR RADIOLOGIST EVAL & MGMT  10/28/2019  . IR RADIOLOGIST EVAL & MGMT  02/15/2020  . LAPAROSCOPIC ABLATION RENAL MASS Right   . PACEMAKER INSERTION      His Family History Is Significant For: No family history on file.  His Social History Is Significant For: Social History   Socioeconomic History  . Marital status: Married    Spouse name: Not on  file  . Number of children: Not on file  . Years of education: Not on file  . Highest education level: Not on file  Occupational History  . Not on file  Tobacco Use  . Smoking status: Former Smoker    Packs/day: 3.00    Years: 30.00    Pack years: 90.00    Quit date: 1984    Years since quitting: 37.7  . Smokeless tobacco: Never Used  Substance and Sexual Activity  . Alcohol use: Not Currently  . Drug use: Never  . Sexual activity: Not on file  Other Topics Concern  . Not on file  Social History Narrative  . Not on file   Social Determinants of Health   Financial Resource Strain:   . Difficulty of Paying Living Expenses: Not on file  Food Insecurity:   . Worried About Programme researcher, broadcasting/film/videounning Out of Food in the Last Year: Not on file  . Ran Out of Food in the Last Year: Not on file  Transportation Needs:   . Lack of Transportation (Medical): Not on file  . Lack of Transportation (Non-Medical): Not on file  Physical Activity:   . Days of Exercise per Week: Not on file  . Minutes of Exercise per Session: Not on file  Stress:   . Feeling of Stress : Not on file  Social Connections:   . Frequency of Communication with Friends and Family: Not on file  . Frequency of Social Gatherings with Friends and Family: Not on file  . Attends Religious Services: Not on file  . Active Member of Clubs or Organizations: Not on file  . Attends BankerClub or Organization Meetings: Not on file  . Marital Status: Not on file    His Allergies Are:  Allergies  Allergen Reactions  . Ambien [Zolpidem] Other (See Comments)    confusion  . Viagra [Sildenafil] Other (See Comments)    flushing  . Penicillins Rash and Other (See Comments)    Welts.  :   His Current Medications Are:  Outpatient Encounter Medications as of 07/26/2020  Medication Sig  . aspirin EC 81 MG tablet Take 81 mg by mouth daily.  Marland Kitchen. atorvastatin (LIPITOR) 80 MG tablet Take 80 mg by mouth at bedtime.   . Cyanocobalamin (RA VITAMIN B12) 2000  MCG TBCR Take 2,000 mcg by mouth daily.  Marland Kitchen. donepezil (ARICEPT) 10 MG tablet Take 10 mg by mouth in the morning and at bedtime.   . ferrous sulfate 325 (65 FE) MG tablet Take 325 mg by mouth daily with breakfast.  . Glucosamine HCl 1500 MG TABS Take 1,500 mg by mouth in the morning and at bedtime.  . memantine (NAMENDA) 10 MG tablet Take 10 mg by mouth 2 (two) times daily.  . metoprolol succinate (TOPROL-XL) 50  MG 24 hr tablet Take 50 mg by mouth daily. Take with or immediately following a meal.  . Multiple Vitamin (MULTIVITAMIN WITH MINERALS) TABS tablet Take 1 tablet by mouth daily. Chewable  . Omega-3 Fatty Acids (FISH OIL) 1200 MG CAPS Take 1,200 mg by mouth in the morning and at bedtime.  . potassium chloride (KLOR-CON) 10 MEQ tablet Take 10 mEq by mouth daily.  . silodosin (RAPAFLO) 4 MG CAPS capsule Take 4 mg by mouth at bedtime.   Marland Kitchen telmisartan (MICARDIS) 80 MG tablet Take 80 mg by mouth daily.  Marland Kitchen tolterodine (DETROL LA) 4 MG 24 hr capsule Take 4 mg by mouth daily.  . [DISCONTINUED] HYDROcodone-acetaminophen (NORCO/VICODIN) 5-325 MG tablet Take 1-2 tablets by mouth every 6 (six) hours as needed for moderate pain.   No facility-administered encounter medications on file as of 07/26/2020.  :  Review of Systems:  Out of a complete 14 point review of systems, all are reviewed and negative with the exception of these symptoms as listed below: Review of Systems  Neurological:       Here for sleep consult. New to inspire therapy.  He reports he fell after the surgery he is completing physical therapy to help increase leg strength.   Epworth Sleepiness Scale 0= would never doze 1= slight chance of dozing 2= moderate chance of dozing 3= high chance of dozing  Sitting and reading:3 Watching TV:3 Sitting inactive in a public place (ex. Theater or meeting):0 As a passenger in a car for an hour without a break:2 Lying down to rest in the afternoon:3 Sitting and talking to  someone:0 Sitting quietly after lunch (no alcohol):3 In a car, while stopped in traffic:0 Total:14     Objective:  Neurological Exam  Physical Exam Physical Examination:   Vitals:   07/26/20 0751  BP: 124/76  Pulse: 75  SpO2: 95%    General Examination: The patient is a very pleasant 84 y.o. male in no acute distress. He appears well-developed and well-nourished and well groomed.   HEENT: Normocephalic, atraumatic, pupils are equal, round and reactive to light, extraocular tracking is good without limitation to gaze excursion or nystagmus noted. Hearing is grossly intact. Face is symmetric with normal facial animation. Speech is clear with no dysarthria noted. There is no hypophonia. There is no lip, neck/head, jaw or voice tremor. Neck is supple with full range of passive and active motion. There are no carotid bruits on auscultation. Oropharynx exam reveals: mild mouth dryness, adequate dental hygiene and moderate airway crowding, due to wider uvula and redundant soft palate.  Well-healing scar under the right side of the chin.  Also unremarkable scar from stimulator placement in the right upper chest, very well-healed scar from the pacemaker on the left.   Chest: Clear to auscultation without wheezing, rhonchi or crackles noted.  Heart: S1+S2+0, regular and normal without murmurs, rubs or gallops noted.   Abdomen: Soft, non-tender and non-distended with normal bowel sounds appreciated on auscultation.  Extremities: There is no pitting edema in the distal lower extremities bilaterally.   Skin: Warm and dry without trophic changes noted.   Musculoskeletal: exam reveals no obvious joint deformities, tenderness or joint swelling or erythema.   Neurologically:  Mental status: The patient is awake, alert and oriented in all 4 spheres. His immediate and remote memory, attention, language skills and fund of knowledge are mildly impaired and details of the history are provided by the  daughter. Mood is normal and affect is normal.  Cranial nerves II - XII are as described above under HEENT exam.  Motor exam: Normal bulk, strength and tone is noted. Romberg is not tested due to safety concerns.  He does have a intermittent tremor in the right more than left upper extremity with posture and also at times at rest on the right>L.  Fine motor skills and coordination: grossly intact.  Cerebellar testing: No dysmetria or intention tremor. There is no truncal or gait ataxia.  Sensory exam: intact to light touch in the upper and lower extremities.  Gait, station and balance: He stands without difficulty and posture is age-appropriate.  He walks without a limp and without a walking aid.  He has preserved arm swing.  Assessment and Plan:  In summary, Icholas Irby is a very pleasant 84 y.o.-year old male with an underlying medical history of arthritis, chronic back pain, coronary artery disease with status post bypass in 1991, sick sinus syndrome with status post pacemaker placement some 15 years ago, history of stroke, tremor, memory loss, for which he sees neurology in [facility], and overweight state, who presents for evaluation of his obstructive sleep apnea.  He had a hypoglossal nerve stimulator placement on 06/14/2020 with good success as far as the surgery goes.  He has a history of CPAP intolerance.  He has a history of moderate obstructive sleep apnea and given his medical history and symptoms, it is also more important that his sleep apnea be treated.  We talked about the diagnosis of OSA, its prognosis and treatment options.  We are planning his inspire activation tomorrow.  He has an appointment in the morning with the sleep lab.  He is advised that he will be given instructions as to how to further titrate at home and we will then bring him in for an overnight titration study about 3 months later.  He will also have a follow-up appointment after his sleep study, during which time we will  review his compliance, his symptoms, and longer-term settings.  I plan to see him back tomorrow during his activation appointment.  I answered all their questions today and the patient and his daughter were in agreement.   Thank you very much for allowing me to participate in the care of this nice patient. If I can be of any further assistance to you please do not hesitate to call me at [phone].  Sincerely,   Jake Foley, MD, PhD

## 2020-07-26 NOTE — Patient Instructions (Signed)
It was nice to meet you and Jake Woods today. I am excited to start the Inspire journey with you!  As discussed, today's appointment was to get to know your sleep history and your examination and your medical history.  You will have your Inspire activation appointment tomorrow, please check in around 7:30 for your 8:00 appointment, check in like you did today, and we will bring you into the sleep lab around 8 AM or earlier.  Tomorrow will be your initial activation of the Inspire device and you will be instructed as to how to turn the device on and off and how to adjust the initial settings at home, which will be your "homework" for the next ensuing weeks. We will then bring you in about 90 days later for an overnight sleep study for your "Inspire titration" to tweak your settings in the hope to reduce and eliminate your sleep apnea.  You will have a follow-up appointment in the office about a month after your sleep study.  I have ordered your overnight sleep study in your chart today.  We will make an office appointment for you at a later date.  You can always call or email [place] through MyChart if you have any interim questions or concerns.

## 2020-07-27 ENCOUNTER — Other Ambulatory Visit: Payer: Self-pay

## 2020-07-27 ENCOUNTER — Encounter: Payer: Self-pay | Admitting: Neurology

## 2020-07-27 ENCOUNTER — Ambulatory Visit (INDEPENDENT_AMBULATORY_CARE_PROVIDER_SITE_OTHER): Payer: Medicare Other | Admitting: Neurology

## 2020-07-27 VITALS — BP 122/78 | HR 90 | Wt 178.3 lb

## 2020-07-27 DIAGNOSIS — G4733 Obstructive sleep apnea (adult) (pediatric): Secondary | ICD-10-CM

## 2020-07-27 DIAGNOSIS — Z789 Other specified health status: Secondary | ICD-10-CM

## 2020-07-27 NOTE — Progress Notes (Signed)
Subjective:    Patient ID: Jake Woods is a 84 y.o. male.  HPI     Interim history:   Jake Woods is an 84 year old right-handed gentleman with an underlying medical history of arthritis, chronic back pain, coronary artery disease with status post bypass in 1991, sick sinus syndrome with status post pacemaker placement some 15 years ago, history of stroke, tremor, memory loss, for which he sees neurology in [facility], and overweight state, who Presents for his inspire activation appointment.  The patient is accompanied by his daughter again today.  I met him on 07/26/2020 at the request of Dr. Melida Quitter in ENT, at which time the patient reported a prior diagnosis of sleep apnea and intolerance to CPAP therapy in the past.  He had evaluation through ENT for inspire candidacy and had a hypoglossal nerve stimulator implanted successfully in July 2021.    Today, 07/27/20: He reports feeling well.  He is situated in our sleep lab for this appointment and we have his device remote control as well as tablet and the sleep lab manager as well as inspire technical representative for assistance and initial activation.  His simulation and sensing went well.  No difficulty was noted.  Patient was very cooperative with the exam.  The patient's allergies, current medications, family history, past medical history, past social history, past surgical history and problem list were reviewed and updated as appropriate.   Previously:   07/26/20: (He) was diagnosed with obstructive sleep apnea but could not tolerate CPAP.  He had a sleep study in [facility] in March 2019 which showed an AHI of 27.2/h, O2 nadir of 78%. He reports that he tried CPAP for about a month.  He gave the machine back.  He has not been sleeping well.  His snoring bothers his wife but his wife's CPAP also bothers him.  He tries to be in bed around 10 PM, rise time is generally between 6 and 7.  He has 2 brothers with sleep apnea and 1 son with sleep  apnea as well.  He does take naps during the day, stays in his recliner and falls asleep inadvertently.  They have 2 dogs and 1 cat in the household.  He drinks no daily caffeine, soda occasionally, tea occasionally.  He quit smoking some 35 years ago and drinks alcohol rarely.  He had inspire evaluation and I reviewed your records.  He had the implantation on 06/14/2020.  He has healed well.  He did have a fall the day after he was released home.  He had to get checked out in [facility] in the emergency room. I reviewed the emergency room records from 06/15/2020.  He had a head CT and cervical spine CT without contrast and I reviewed the results: IMPRESSION: 1. Mild diffuse cortical atrophy. Mild chronic ischemic white matter disease. No acute intracranial abnormality seen. 2. Multilevel degenerative disc disease. No acute abnormality seen in the cervical spine.   He also had a chest x-ray without any acute cardiopulmonary disease seen.   His Epworth sleepiness score is 14 out of 24, fatigue severity score is 14 out of 63.  His Past Medical History Is Significant For: Past Medical History:  Diagnosis Date  . Arthritis    hands  . Chronic back pain   . Coronary artery disease 1991   CABG at [facility]  . Diabetes mellitus without complication ([place])   . Hepatitis    not sure which one  . Hypertension   .  Memory deficits   . Presence of permanent cardiac pacemaker    SSS  . Sleep apnea    did not tolerate CPAP  . Spondylosis of lumbar spine   . Stroke [facility])    (post CABG per patient's record)  . Tremor     His Past Surgical History Is Significant For: Past Surgical History:  Procedure Laterality Date  . CORONARY ARTERY BYPASS GRAFT  1991  . DRUG INDUCED ENDOSCOPY N/A 05/03/2020   Procedure: DRUG INDUCED ENDOSCOPY;  Surgeon: Melida Quitter, MD;  Location: [place];  Service: ENT;  Laterality: N/A;  . IMPLANTATION OF HYPOGLOSSAL NERVE STIMULATOR Right 06/14/2020   Procedure:  IMPLANTATION OF HYPOGLOSSAL NERVE STIMULATOR;  Surgeon: Melida Quitter, MD;  Location: [place];  Service: ENT;  Laterality: Right;  . IR RADIOLOGIST EVAL & MGMT  07/28/2019  . IR RADIOLOGIST EVAL & MGMT  10/28/2019  . IR RADIOLOGIST EVAL & MGMT  02/15/2020  . LAPAROSCOPIC ABLATION RENAL MASS Right   . PACEMAKER INSERTION      His Family History Is Significant For: No family history on file.  His Social History Is Significant For: Social History   Socioeconomic History  . Marital status: Married    Spouse name: Not on file  . Number of children: Not on file  . Years of education: Not on file  . Highest education level: Not on file  Occupational History  . Not on file  Tobacco Use  . Smoking status: Former Smoker    Packs/day: 3.00    Years: 30.00    Pack years: 90.00    Quit date: 1984    Years since quitting: 37.7  . Smokeless tobacco: Never Used  Substance and Sexual Activity  . Alcohol use: Not Currently  . Drug use: Never  . Sexual activity: Not on file  Other Topics Concern  . Not on file  Social History Narrative  . Not on file   Social Determinants of Health   Financial Resource Strain:   . Difficulty of Paying Living Expenses: Not on file  Food Insecurity:   . Worried About Charity fundraiser in the Last Year: Not on file  . Ran Out of Food in the Last Year: Not on file  Transportation Needs:   . Lack of Transportation (Medical): Not on file  . Lack of Transportation (Non-Medical): Not on file  Physical Activity:   . Days of Exercise per Week: Not on file  . Minutes of Exercise per Session: Not on file  Stress:   . Feeling of Stress : Not on file  Social Connections:   . Frequency of Communication with Friends and Family: Not on file  . Frequency of Social Gatherings with Friends and Family: Not on file  . Attends Religious Services: Not on file  . Active Member of Clubs or Organizations: Not on file  . Attends Archivist Meetings: Not on file   . Marital Status: Not on file    His Allergies Are:  Allergies  Allergen Reactions  . Ambien [Zolpidem] Other (See Comments)    confusion  . Viagra [Sildenafil] Other (See Comments)    flushing  . Penicillins Rash and Other (See Comments)    Welts.  :   His Current Medications Are:  Outpatient Encounter Medications as of 07/27/2020  Medication Sig  . aspirin EC 81 MG tablet Take 81 mg by mouth daily.  Marland Kitchen atorvastatin (LIPITOR) 80 MG tablet Take 80 mg by mouth  at bedtime.   . Cyanocobalamin (RA VITAMIN B12) 2000 MCG TBCR Take 2,000 mcg by mouth daily.  Marland Kitchen donepezil (ARICEPT) 10 MG tablet Take 10 mg by mouth in the morning and at bedtime.   . ferrous sulfate 325 (65 FE) MG tablet Take 325 mg by mouth daily with breakfast.  . Glucosamine HCl 1500 MG TABS Take 1,500 mg by mouth in the morning and at bedtime.  . memantine (NAMENDA) 10 MG tablet Take 10 mg by mouth 2 (two) times daily.  . metoprolol succinate (TOPROL-XL) 50 MG 24 hr tablet Take 50 mg by mouth daily. Take with or immediately following a meal.  . Multiple Vitamin (MULTIVITAMIN WITH MINERALS) TABS tablet Take 1 tablet by mouth daily. Chewable  . Omega-3 Fatty Acids (FISH OIL) 1200 MG CAPS Take 1,200 mg by mouth in the morning and at bedtime.  . potassium chloride (KLOR-CON) 10 MEQ tablet Take 10 mEq by mouth daily.  . silodosin (RAPAFLO) 4 MG CAPS capsule Take 4 mg by mouth at bedtime.   Marland Kitchen telmisartan (MICARDIS) 80 MG tablet Take 80 mg by mouth daily.  Marland Kitchen tolterodine (DETROL LA) 4 MG 24 hr capsule Take 4 mg by mouth daily.   No facility-administered encounter medications on file as of 07/27/2020.  :  Review of Systems:  Out of a complete 14 point review of systems, all are reviewed and negative with the exception of these symptoms as listed below: Review of Systems  Neurological:       Here for inspire activation.     Objective:  Neurological Exam  Physical Exam Physical Examination:   Vitals:   07/27/20 0751  BP:  122/78  Pulse: 90  SpO2: 94%   General Examination: The patient is a very pleasant 84 y.o. male in no acute distress. He appears well-developed and well-nourished and well groomed.   Initial activation settings: Initial amplitude 2.4 V with the goal of titrating to 3.4.  Start delay was set at 30 minutes, pause time 15 minutes, therapy duration 8 hours.  Patient reported no discomfort with the stimulation levels.  HEENT: Normocephalic, atraumatic, tracking is well preserved. Face is symmetric with normal facial animation. Speech is clear with no dysarthria noted. Well-healing scar under the right side of the chin.    Chest:  Stimulation and sensing went well.   Abdomen: Soft, non-tender and non-distended.  Extremities: There is no obvious edema in the distal lower extremities bilaterally.   Skin: Warm and dry without trophic changes noted.   Musculoskeletal: exam reveals no obvious joint deformities, tenderness or joint swelling or erythema.   Neurologically:  Mental status: The patient is awake, alert and cooperative. Mood is normal and affect is normal.  Cranial nerves II - XII are as described above under HEENT exam.  Motor exam: Normal bulk, normal movements of all 4 extr. He does have a intermittent tremor in the right more than left upper extremity with a mild resting component, R>L. Fine motor skills and coordination: grossly intact for age.  Cerebellar testing: No dysmetria or intention tremor. There is no truncal or gait ataxia.  Sensory exam: intact to light touch in the upper and lower extremities.  Gait, station and balance: He stands without difficulty and posture is age-appropriate.  He walks without a limp and without a walking aid.   Assessment and Plan:  In summary, Zeyad Delaguila is a very pleasant 84 year old male with an underlying medical history of arthritis, chronic back pain, coronary artery disease  with status post bypass in 1991, sick sinus syndrome with  status post pacemaker placement some 15 years ago, history of stroke, tremor, memory loss, for which he sees neurology in [facility], and overweight state, who presents for initial activation of his inspire stimulator.  He had the hypoglossal nerve stimulator placed on 06/14/2020 without any problem under the care of Dr. Melida Quitter.  He has a history of CPAP intolerance for his moderate obstructive sleep apnea.  His initial activation went well today.  He has been set to a initial amplitude of 2.4 V with the goal to titrate in the next 3 months to up to 3.4 V.  He will have a start delay of 30 minutes, 8 hours of therapy time and pause time of 15 minutes.  He is scheduled for a titration study with [place] on 10/30/2020 and a subsequent follow-up appointment about a month later on 11/30/2020.  He was given instructions and his daughter also helped with the appointment today and will help him at home.  He was given phone numbers for possible questions they may have and also the direct number for our sleep lab manager.  There is also a 24-hour helpline for the inspire device.  The patient tolerated the activation well.  We are looking forward to working with him further and seeing him back for his titration study in December.  Approximately 30 days from now we will call him to check in on him.  He will also be scheduled for a remote download approximately 2 weeks prior to his sleep study.  I answered all the questions today and the patient and his daughter were in agreement.

## 2020-07-27 NOTE — Patient Instructions (Signed)
Given verbally and separate Inspire instructions, remote, booklet, etc.

## 2020-08-28 ENCOUNTER — Telehealth: Payer: Self-pay

## 2020-08-28 NOTE — Telephone Encounter (Signed)
LVM to check on patient and his Inspire device. Asked for a call back.

## 2020-10-30 ENCOUNTER — Ambulatory Visit (INDEPENDENT_AMBULATORY_CARE_PROVIDER_SITE_OTHER): Payer: Medicare Other | Admitting: Neurology

## 2020-10-30 DIAGNOSIS — Z789 Other specified health status: Secondary | ICD-10-CM

## 2020-10-30 DIAGNOSIS — G4720 Circadian rhythm sleep disorder, unspecified type: Secondary | ICD-10-CM

## 2020-10-30 DIAGNOSIS — G4733 Obstructive sleep apnea (adult) (pediatric): Secondary | ICD-10-CM | POA: Diagnosis not present

## 2020-10-30 DIAGNOSIS — Z9682 Presence of neurostimulator: Secondary | ICD-10-CM

## 2020-11-08 NOTE — Progress Notes (Signed)
Patient had inspire implantation on 06/14/2020 under Dr. Jenne Pane.  He had his activation on 07/27/2020 and titration study on 10/30/2020.  Please advise patient that he did reasonably well with the [facility], he did have some difficulty maintaining sleep. We will make the setting changes when he comes back for his appointment on 11/30/2020.

## 2020-11-08 NOTE — Procedures (Signed)
PATIENT'S NAME:  Jake Woods, Jake Woods DOB:      02-23-1936      MR#:    [id number]     DATE OF RECORDING: 10/30/2020 REFERRING M.D.:  Dr. Christia Reading Study Performed:   Inspire Titration HISTORY: 84 year old right-handed gentleman with an underlying medical history of arthritis, chronic back pain, coronary artery disease with status post bypass in 1991, sick sinus syndrome with status post pacemaker placement some 15 years ago, history of stroke, tremor, memory loss, for which he sees neurology in [facility], and overweight state, who Presents for his inspire titration. He had a sleep study in [facility] in March 2019 which showed an AHI of 27.2/h, O2 nadir of 78%. His implantation date was 06/14/20. His activation was on 07/27/20. He was set to an initial amplitude of 2.4 V with the goal to titrate in 3 months to up to 3.4 V. Start delay was 30 minutes, 8 hours of therapy time and pause time of 15 minutes. The patient's weight 179 pounds with a height of 65 (inches), resulting in a BMI of 29.8 kg/m2. The patient's neck circumference measured 15 inches.  CURRENT MEDICATIONS: Aspirin, Lipitor, Aricept, Namenda, Toprol, Rapaflo, Detrol, Micardis    PROCEDURE:  This is a multichannel digital polysomnogram utilizing the SomnoStar 11.2 system.  Electrodes and sensors were applied and monitored per AASM Specifications.   EEG, EOG, Chin and Limb EMG, were sampled at 200 Hz.  ECG, Snore and Nasal Pressure, Thermal Airflow, Respiratory Effort, CPAP Flow and Pressure, Oximetry was sampled at 50 Hz. Digital video and audio were recorded.      The patient's incoming amplitude was 3.3 V.  Inspire was titrated to maximum amplitude of 4.1 V.  His therapeutic amplitude was found to be around 3.5 V at which point his AHI was estimated to be 14/h.  The patient had difficulty maintaining sleep with significant sleep fragmentation noted especially later in the night.  After completion of the study the patient was programmed back to  the incoming amplitude of 3.3 V.  Lower limit was programmed to be 0.2 V below the incoming amplitude and upper limit was programmed to be 1 V above the lower limit.  He was advised that the final settings will be programmed after his follow-up visit.  Lights Out was at 21:57 and Lights On at 04:50. Total recording time (TRT) was 407 minutes, with a total sleep time (TST) of 343.5 minutes. The patient's sleep latency was 1.5 minutes. REM latency was 32.5 minutes.  The sleep efficiency was 84.4 %.    SLEEP ARCHITECTURE: WASO (Wake after sleep onset) was 63 minutes.  There were 37.5 minutes in Stage N1, 73 minutes Stage N2, 202 minutes Stage N3 and 36 minutes in Stage REM.  The percentage of Stage N1 was 10.8%, Stage N2 was 20.9%, Stage N3 was 58.% and Stage R (REM sleep) was 10.3%, which is reduced. The arousals were noted as: 76 were spontaneous, 0 were associated with PLMs, 29 were associated with respiratory events.  RESPIRATORY ANALYSIS:  There was a total of 115 respiratory events: 25 obstructive apneas, 0 central apneas and 0 mixed apneas with a total of 25 apneas and an apnea index (AI) of 4.4 /hour. There were 90 hypopneas with a hypopnea index of 15.7/hour. The patient also had 0 respiratory event related arousals (RERAs).      The total APNEA/HYPOPNEA INDEX  (AHI) was 20.1 /hour and the total RESPIRATORY DISTURBANCE INDEX was 20.1 /hour  24 events  occurred in REM sleep and 91 events in NREM. The REM AHI was 40 /hour versus a non-REM AHI of 17.8 /hour.  The patient spent 230 minutes of total sleep time in the supine position and 119 minutes in non-supine. The supine AHI was 27.5/hour, versus a non-supine AHI of 6.1/hour.  OXYGEN SATURATION & C02:  The baseline 02 saturation was 0%, with the lowest being 74%. Time spent below 89% saturation equaled 44 minutes.  PERIODIC LIMB MOVEMENTS:  The patient had a total of 0 Periodic Limb Movements. The Periodic Limb Movement (PLM) index was 0/hour and the  PLM Arousal index was 0 /hour.  Audio and video analysis did not show any abnormal or unusual movements, behaviors, phonations or vocalizations. The patient took 1 bathroom break. Some snoring was noted during supine sleep. The EKG was in keeping with normal sinus rhythm (NSR).  Post-study, the patient indicated that sleep was the same as usual.   DIAGNOSIS 1. OSA - Obstructive Sleep Apnea  2. CPAP intolerance 3. Inspire Stimulator placement 4. Dysfunctions associated with sleep stages or arousals from sleep  PLANS/RECOMMENDATIONS: 1. This was a difficult titration.  Patient had difficulty maintaining sleep. I will recommend a therapeutic pressure of 3.5 V for now. The patient's incoming amplitude was 3.3 V.  Inspire was titrated to maximum amplitude of 4.1 V.  His therapeutic amplitude was found to be around 3.5 V at which point his AHI was estimated to be 14/h.  The patient had difficulty maintaining sleep with significant sleep fragmentation noted especially later in the night.  After completion of the study the patient was programmed back to the incoming amplitude of 3.3 V.  Lower limit was programmed to be 0.2 V below the incoming amplitude and upper limit was programmed to be 1 V above the lower limit. He was advised that the final settings will be programmed after his follow-up visit, which is pending.    2. This study shows sleep fragmentation and abnormal sleep stage percentages; these are nonspecific findings and per se do not signify an intrinsic sleep disorder or a cause for the patient's sleep-related symptoms. Causes include (but are not limited to) the first night effect of the sleep study, circadian rhythm disturbances, medication effect or an underlying mood disorder or medical problem.  3. The patient should be cautioned not to drive, work at heights, or operate dangerous or heavy equipment when tired or sleepy. Review and reiteration of good sleep hygiene measures should be pursued  with any patient. 4. The patient will be seen in follow-up in the sleep clinic at [facility] for discussion of the test results, symptom and treatment compliance review, further management strategies, etc. The referring provider will be notified of the test results.  I certify that I have reviewed the entire raw data recording prior to the issuance of this report in accordance with the Standards of Accreditation of the American Academy of Sleep Medicine (AASM)  [name], MD, PhD Diplomat, American Board of Neurology and Sleep Medicine (Neurology and Sleep Medicine)

## 2020-11-13 ENCOUNTER — Telehealth: Payer: Self-pay | Admitting: Neurology

## 2020-11-13 NOTE — Telephone Encounter (Signed)
-----   Message from [name], MD sent at 11/08/2020 10:09 AM EST ----- Patient had inspire implantation on 06/14/2020 under Dr. Jenne Pane.  He had his activation on 07/27/2020 and titration study on 10/30/2020.  Please advise patient that he did reasonably well with the [facility], he did have some difficulty maintaining sleep. We will make the setting changes when he comes back for his appointment on 11/30/2020.

## 2020-11-13 NOTE — Telephone Encounter (Signed)
Called the patient's daughter and advised that the titration study with inspire was hard to address due to the patient not sleeping as well. Advised they were able to increase the inspire during the study to 3.5V and AHI was decreased to 14/hr. Pt is scheduled 1/13, advised that we will make the necessary changes at that upcoming apt. She stated the patient felt the length of time was set for a longer period time. I advised there was nothing in the study to suggest there was changes to time of the device. Advised we will be able to answer that question at the time of the visit on the 13 th when we interrogate the machine. She verbalized understanding.

## 2020-11-30 ENCOUNTER — Ambulatory Visit (INDEPENDENT_AMBULATORY_CARE_PROVIDER_SITE_OTHER): Payer: Medicare Other | Admitting: Neurology

## 2020-11-30 ENCOUNTER — Other Ambulatory Visit: Payer: Self-pay

## 2020-11-30 ENCOUNTER — Encounter: Payer: Self-pay | Admitting: Neurology

## 2020-11-30 VITALS — BP 141/75 | HR 69 | Ht 65.0 in | Wt 177.0 lb

## 2020-11-30 DIAGNOSIS — Z4542 Encounter for adjustment and management of neuropacemaker (brain) (peripheral nerve) (spinal cord): Secondary | ICD-10-CM

## 2020-11-30 DIAGNOSIS — Z9682 Presence of neurostimulator: Secondary | ICD-10-CM | POA: Diagnosis not present

## 2020-11-30 DIAGNOSIS — Z789 Other specified health status: Secondary | ICD-10-CM | POA: Diagnosis not present

## 2020-11-30 DIAGNOSIS — G4733 Obstructive sleep apnea (adult) (pediatric): Secondary | ICD-10-CM | POA: Diagnosis not present

## 2020-11-30 NOTE — Patient Instructions (Signed)
It was good to see you again today.   As discussed, you can increase your Inspire stimulation to the next level up whenever you are ready, after about 2 weeks, to a stimulation amplitude of 3.5V, you have been at 3.3 V and we are changing to 3.4 V today.   Your device is programmed to allow you to go up to about 2 levels down and 3 levels further up in case we need additional levels of stimulation in the future but for now, you may want to just stay at that 3.5 V level once you increase it in about 2 weeks.    Let's keep your start delay at 30 minutes and your pause at 15 minutes. However, if you get up in the middle of the night to eat or drink or talk to someone, you should pause it, and if you wake up early to start your day before the 8-hour treatment time is over, you should turn the unit off.  Please follow-up routinely in 6 months, call us with any interim questions or concerns.  We will consider doing a home sleep test in about 6 months to see how your apnea scores and snoring look and how you feel at the time.

## 2020-11-30 NOTE — Progress Notes (Signed)
Subjective:    Patient ID: Jake Woods is a 85 y.o. male.  HPI     Interim history:   Jake Woods is an 85 year old right-handed gentleman with an underlying medical history of arthritis, chronic back pain, coronary artery disease with status post bypass in 1991, sick sinus syndrome with status post pacemaker placement some 15 years ago, history of stroke, tremor, memory loss, for which he sees neurology in [facility], and overweight state, who presents for his inspire adjustment appointment after his recent titration study.  The patient is accompanied by his daughter again today. The Inspire rep, Loma Sousa is here for the appointment as well.  I last saw him on 07/27/20 for is Inspire activation. He had the hypoglossal nerve stimulator placed on 06/14/2020 without any problem under the care of Dr. Melida Quitter. He has a history of CPAP intolerance for his moderate obstructive sleep apnea.  His initial activation went well, he was set to an initial amplitude of 2.4 V with the goal to titrate over the next 3 months to up to 3.4 V. His start delay was 30 minutes, 8 hours of therapy time and pause time of 15 minutes. He was scheduled for a titration study.    He had an Inspire titration study on 10/30/20. His overall sleep efficiency was 84.4%, sleep latency 1.5 minutes, REM latency 32.5 minutes.  He did have significant sleep fragmentation especially later in the night.  It was a somewhat difficult titration.  Recommended therapeutic pressure was 3.5 V for now. The patient's incoming amplitude was 3.3 V.  Inspire was titrated to maximum amplitude of 4.1 V.  His therapeutic amplitude was found to be around 3.5 V at which point his AHI was estimated to be 14/h. After completion of the study the patient was programmed back to the incoming amplitude of 3.3 V.  Lower limit was programmed to be 0.2 V below the incoming amplitude and upper limit was programmed to be 1 V above the lower limit. He was advised that the  final settings will be programmed after his follow-up visit.      Today, 11/30/20: He reports he and his daughter report that he is doing well.  He tends to not pause the inspire or turn it off in the mornings.  He does feels that he needed to because it or stop it in the mornings.  He sleeps on average 8-1/2 hours with it.  We were able to review a treatment report for the past 30 days from 10/30/2020 through 11/29/2020.  He used his machine 21 nights with percent use days greater than 4 hours at 70%.  Average usage of 8 hours and 30 minutes.  He reports that he does not use it when he watches sports on TV and hence he has had some lapses in treatment but when he uses it he sleeps well with it.  He does not have any discomfort or any issues currently.  He has healed well from the surgery standpoint.  Level of stimulation is at 3.3 V.  He has not really made any changes lately.  Pause time is 15 minutes and start delay at 30 minutes.  He generally goes to bed around 10 PM and rise time is around 6 AM.    The patient's allergies, current medications, family history, past medical history, past social history, past surgical history and problem list were reviewed and updated as appropriate.    Previously:   I first met him on 07/26/2020  at the request of Dr. Melida Quitter in ENT, at which time the patient reported a prior diagnosis of sleep apnea and intolerance to CPAP therapy in the past.  He had evaluation through ENT for inspire candidacy and had a hypoglossal nerve stimulator implanted successfully in July 2021.      07/26/20: (He) was diagnosed with obstructive sleep apnea but could not tolerate CPAP.  He had a sleep study in [facility] in March 2019 which showed an AHI of 27.2/h, O2 nadir of 78%. He reports that he tried CPAP for about a month.  He gave the machine back.  He has not been sleeping well.  His snoring bothers his wife but his wife's CPAP also bothers him.  He tries to be in bed around 10 PM,  rise time is generally between 6 and 7.  He has 2 brothers with sleep apnea and 1 son with sleep apnea as well.  He does take naps during the day, stays in his recliner and falls asleep inadvertently.  They have 2 dogs and 1 cat in the household.  He drinks no daily caffeine, soda occasionally, tea occasionally.  He quit smoking some 35 years ago and drinks alcohol rarely.  He had inspire evaluation and I reviewed your records.  He had the implantation on 06/14/2020.  He has healed well.  He did have a fall the day after he was released home.  He had to get checked out in [facility] in the emergency room. I reviewed the emergency room records from 06/15/2020.  He had a head CT and cervical spine CT without contrast and I reviewed the results: IMPRESSION: 1. Mild diffuse cortical atrophy. Mild chronic ischemic white matter disease. No acute intracranial abnormality seen. 2. Multilevel degenerative disc disease. No acute abnormality seen in the cervical spine.   He also had a chest x-ray without any acute cardiopulmonary disease seen.   His Epworth sleepiness score is 14 out of 24, fatigue severity score is 14 out of 63.    His Past Medical History Is Significant For: Past Medical History:  Diagnosis Date  . Arthritis    hands  . Chronic back pain   . Coronary artery disease 1991   CABG at [facility]  . Diabetes mellitus without complication ([place])   . Hepatitis    not sure which one  . Hypertension   . Memory deficits   . Presence of permanent cardiac pacemaker    SSS  . Sleep apnea    did not tolerate CPAP  . Spondylosis of lumbar spine   . Stroke [facility])    (post CABG per patient's record)  . Tremor     His Past Surgical History Is Significant For: Past Surgical History:  Procedure Laterality Date  . CORONARY ARTERY BYPASS GRAFT  1991  . DRUG INDUCED ENDOSCOPY N/A 05/03/2020   Procedure: DRUG INDUCED ENDOSCOPY;  Surgeon: Melida Quitter, MD;  Location: [place];  Service:  ENT;  Laterality: N/A;  . IMPLANTATION OF HYPOGLOSSAL NERVE STIMULATOR Right 06/14/2020   Procedure: IMPLANTATION OF HYPOGLOSSAL NERVE STIMULATOR;  Surgeon: Melida Quitter, MD;  Location: [place];  Service: ENT;  Laterality: Right;  . IR RADIOLOGIST EVAL & MGMT  07/28/2019  . IR RADIOLOGIST EVAL & MGMT  10/28/2019  . IR RADIOLOGIST EVAL & MGMT  02/15/2020  . LAPAROSCOPIC ABLATION RENAL MASS Right   . PACEMAKER INSERTION      His Family History Is Significant For: No family history on  file.  His Social History Is Significant For: Social History   Socioeconomic History  . Marital status: Married    Spouse name: Not on file  . Number of children: Not on file  . Years of education: Not on file  . Highest education level: Not on file  Occupational History  . Not on file  Tobacco Use  . Smoking status: Former Smoker    Packs/day: 3.00    Years: 30.00    Pack years: 90.00    Quit date: 1984    Years since quitting: 38.0  . Smokeless tobacco: Never Used  Substance and Sexual Activity  . Alcohol use: Not Currently  . Drug use: Never  . Sexual activity: Not on file  Other Topics Concern  . Not on file  Social History Narrative   Lives with wife   Social Determinants of Health   Financial Resource Strain: Not on file  Food Insecurity: Not on file  Transportation Needs: Not on file  Physical Activity: Not on file  Stress: Not on file  Social Connections: Not on file    His Allergies Are:  Allergies  Allergen Reactions  . Ambien [Zolpidem] Other (See Comments)    confusion  . Viagra [Sildenafil] Other (See Comments)    flushing  . Penicillins Rash and Other (See Comments)    Welts.  :   His Current Medications Are:  Outpatient Encounter Medications as of 11/30/2020  Medication Sig  . aspirin EC 81 MG tablet Take 81 mg by mouth daily.  Marland Kitchen atorvastatin (LIPITOR) 80 MG tablet Take 80 mg by mouth at bedtime.   . Cyanocobalamin (RA VITAMIN B12) 2000 MCG TBCR Take 2,000 mcg by  mouth daily.  Marland Kitchen donepezil (ARICEPT) 10 MG tablet Take 10 mg by mouth in the morning and at bedtime.   . Glucosamine HCl 1500 MG TABS Take 1,500 mg by mouth in the morning and at bedtime.  . memantine (NAMENDA) 10 MG tablet Take 10 mg by mouth 2 (two) times daily.  . metoprolol succinate (TOPROL-XL) 50 MG 24 hr tablet Take 50 mg by mouth daily. Take with or immediately following a meal.  . Multiple Vitamin (MULTIVITAMIN WITH MINERALS) TABS tablet Take 1 tablet by mouth daily. Chewable  . Omega-3 Fatty Acids (FISH OIL) 1200 MG CAPS Take 1,200 mg by mouth in the morning and at bedtime.  . potassium chloride (KLOR-CON) 10 MEQ tablet Take 10 mEq by mouth daily.  . sertraline (ZOLOFT) 25 MG tablet Take 25 mg by mouth daily.  . silodosin (RAPAFLO) 4 MG CAPS capsule Take 4 mg by mouth at bedtime.   Marland Kitchen telmisartan (MICARDIS) 80 MG tablet Take 80 mg by mouth daily.  Marland Kitchen tolterodine (DETROL LA) 4 MG 24 hr capsule Take 4 mg by mouth daily.  . ferrous sulfate 325 (65 FE) MG tablet Take 325 mg by mouth daily with breakfast. (Patient not taking: Reported on 11/30/2020)   No facility-administered encounter medications on file as of 11/30/2020.  :  Review of Systems:  Out of a complete 14 point review of systems, all are reviewed and negative with the exception of these symptoms as listed below: Review of Systems  Neurological:       Rm 1 with dgtr Morey Hummingbird, FU to Insprie titration.  Epworth Sleepiness Scale 0= would never doze 1= slight chance of dozing 2= moderate chance of dozing 3= high chance of dozing  Sitting and reading:2 Watching TV:2 Sitting inactive in a public place (ex. Theater  or meeting):2 As a passenger in a car for an hour without a break:2 Lying down to rest in the afternoon:3 Sitting and talking to someone:0 Sitting quietly after lunch (no alcohol):2 In a car, while stopped in traffic:1 Total:13    Objective:  Neurological Exam  Physical Exam Physical Examination:   Vitals:    11/30/20 0905  BP: (!) 141/75  Pulse: 69    General Examination: The patient is a very pleasant 85 y.o. male in no acute distress. He appears well-developed and well-nourished and well groomed.   HEENT:Normocephalic, atraumatic, tracking is well preserved. Face is symmetric with normal facial animation. Speech is clear with no dysarthria noted. Well-healed scar under the right side of the chin.   Chest: Stimulation and sensing went well.  Chest clear to auscultation, unremarkable midline scar on the chest, unremarkable pacemaker site, unremarkable inspire site on the right upper chest.  Abdomen:Soft, non-tender and non-distended.  Extremities:There isnoobvious edema in the distal lower extremities bilaterally.   Skin: Warm and dry without trophic changes noted.   Musculoskeletal: exam reveals no obvious joint deformities, tenderness or joint swelling or erythema.   Neurologically:  Mental status: The patient is awake, alert and cooperative. Mood isnormaland affect is normal.  Cranial nerves II - XII are as described above under HEENT exam.  Motor exam: Normal bulk, normal movements of all 4 extr. He does have a intermittent tremor in the right more than left upper extremity with a mild resting component, R>L.Fine motor skills and coordination: grossly intact for age.  Cerebellar testing: No dysmetria or intention tremor. There is no truncal or gait ataxia.  Sensory exam: intact to light touch in the upper and lower extremities.  Gait, station and balance:Hestands and walks without difficulty.    Assessmentand Plan:  In summary,Jake Woods a very pleasant 27 year oldmalewith an underlying complex medical history of arthritis, chronic back pain, coronary artery disease with status post bypass in 1991, sick sinus syndrome with status post pacemaker placement some 15 years ago, history of stroke, tremor, memory loss, for which he sees neurology in [facility], and  overweight state, whopresents for follow-up consultation of his obstructive sleep apnea, on inspire stimulator.  He had an interim inspire titration sleep study on 10/30/2020.  From the inspire standpoint he did well but he did have sleep disruption later at night, during the second half of the study he had significant sleep fragmentation.  Currently, he is sleeping quite well.  His average usage time is 8-1/2 hours, he has had some lapses in treatment and is encouraged to be consistent with treatment.  He reports benefit from treatment and has had no issues tolerating it.  He did get some additional education and tutorial on how to turn the unit off and change the levels of stimulation as well as how to initiate the 15-minute pause if needed in the middle of the night.  His daughter is comfortable helping him with that as well to give additional education if needed.  She is comfortable with the inspire remote usage.  We mutually agreed to increase his stimulation from 3.3 incoming to 3.4 V at this time and he is encouraged to increase by 1 level in about 2 weeks from now if tolerated. Of note, he had the hypoglossal nerve stimulator placed on 06/14/2020 without any problem under the care of Dr. Melida Quitter.  He has a history of CPAP intolerance for his moderate obstructive sleep apnea.  His initial activation went well on  07/27/2020.  Initial amplitude was 2.4 V with the goal to titrate in 3 months to up to 3.4 V. we mutually agreed to keep the start delay at 30 minutes, therapy time at 8 hours and pause time and 15 minutes.  We talked about his titration results and therapy report he had additional time for education and setting changes with the inspire representative.  We will plan a follow-up in 6 months for a routine recheck and will consider doing a home sleep test at the time to recheck therapeutic efficacy.   I answered all the questions today and the patient and his daughter were in agreement.  I spent 30  minutes in total face-to-face time and in reviewing records during pre-charting, more than 50% of which was spent in counseling and coordination of care, reviewing test results, reviewing medications and treatment regimen and/or in discussing or reviewing the diagnosis of OSA, the prognosis and treatment options. Pertinent laboratory and imaging test results that were available during this visit with the patient were reviewed by me and considered in my medical decision making (see chart for details).

## 2020-12-28 IMAGING — DX DG CHEST 1V
1 series · 2 of 2 positions shown · non-contrast
Comparison: None.

CLINICAL DATA: Hypoglossal nerve stimulator.

EXAM:
CHEST  1 VIEW

[Series 1: chest · 0.14mm/px · 2 of 2 slices shown]
[im 1/2]
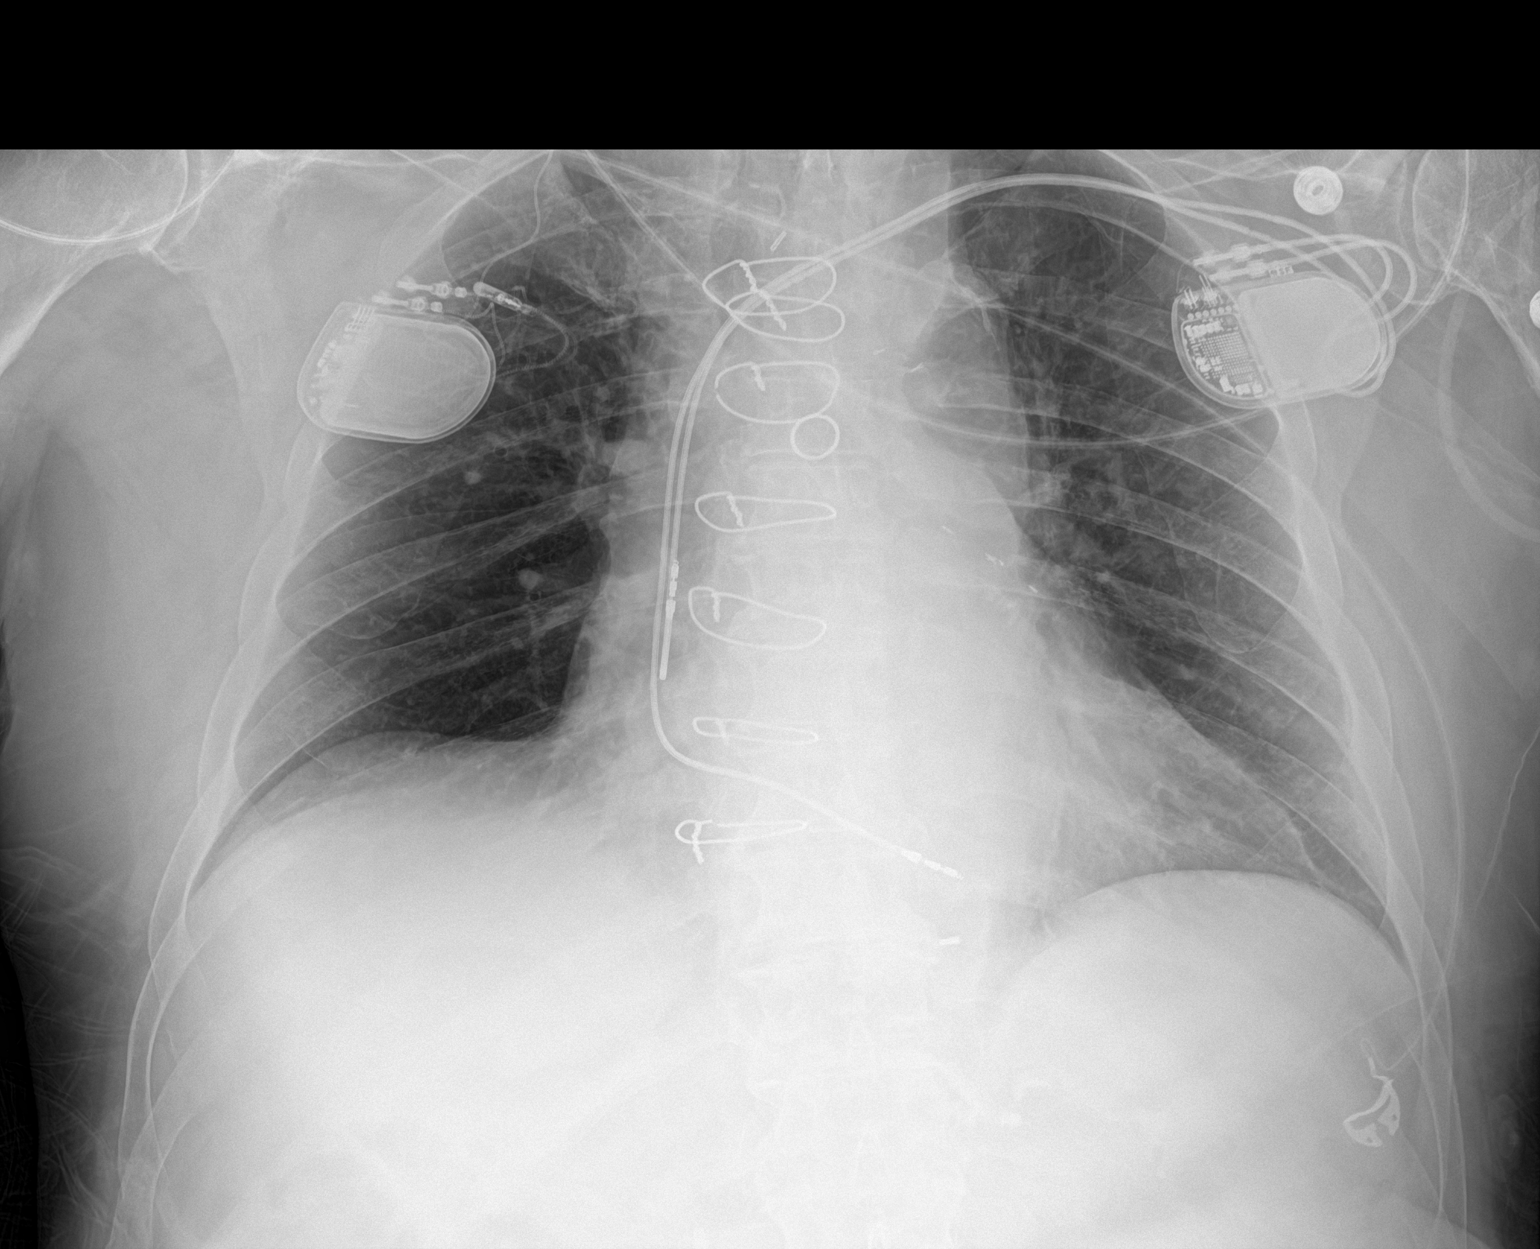
[im 2/2]
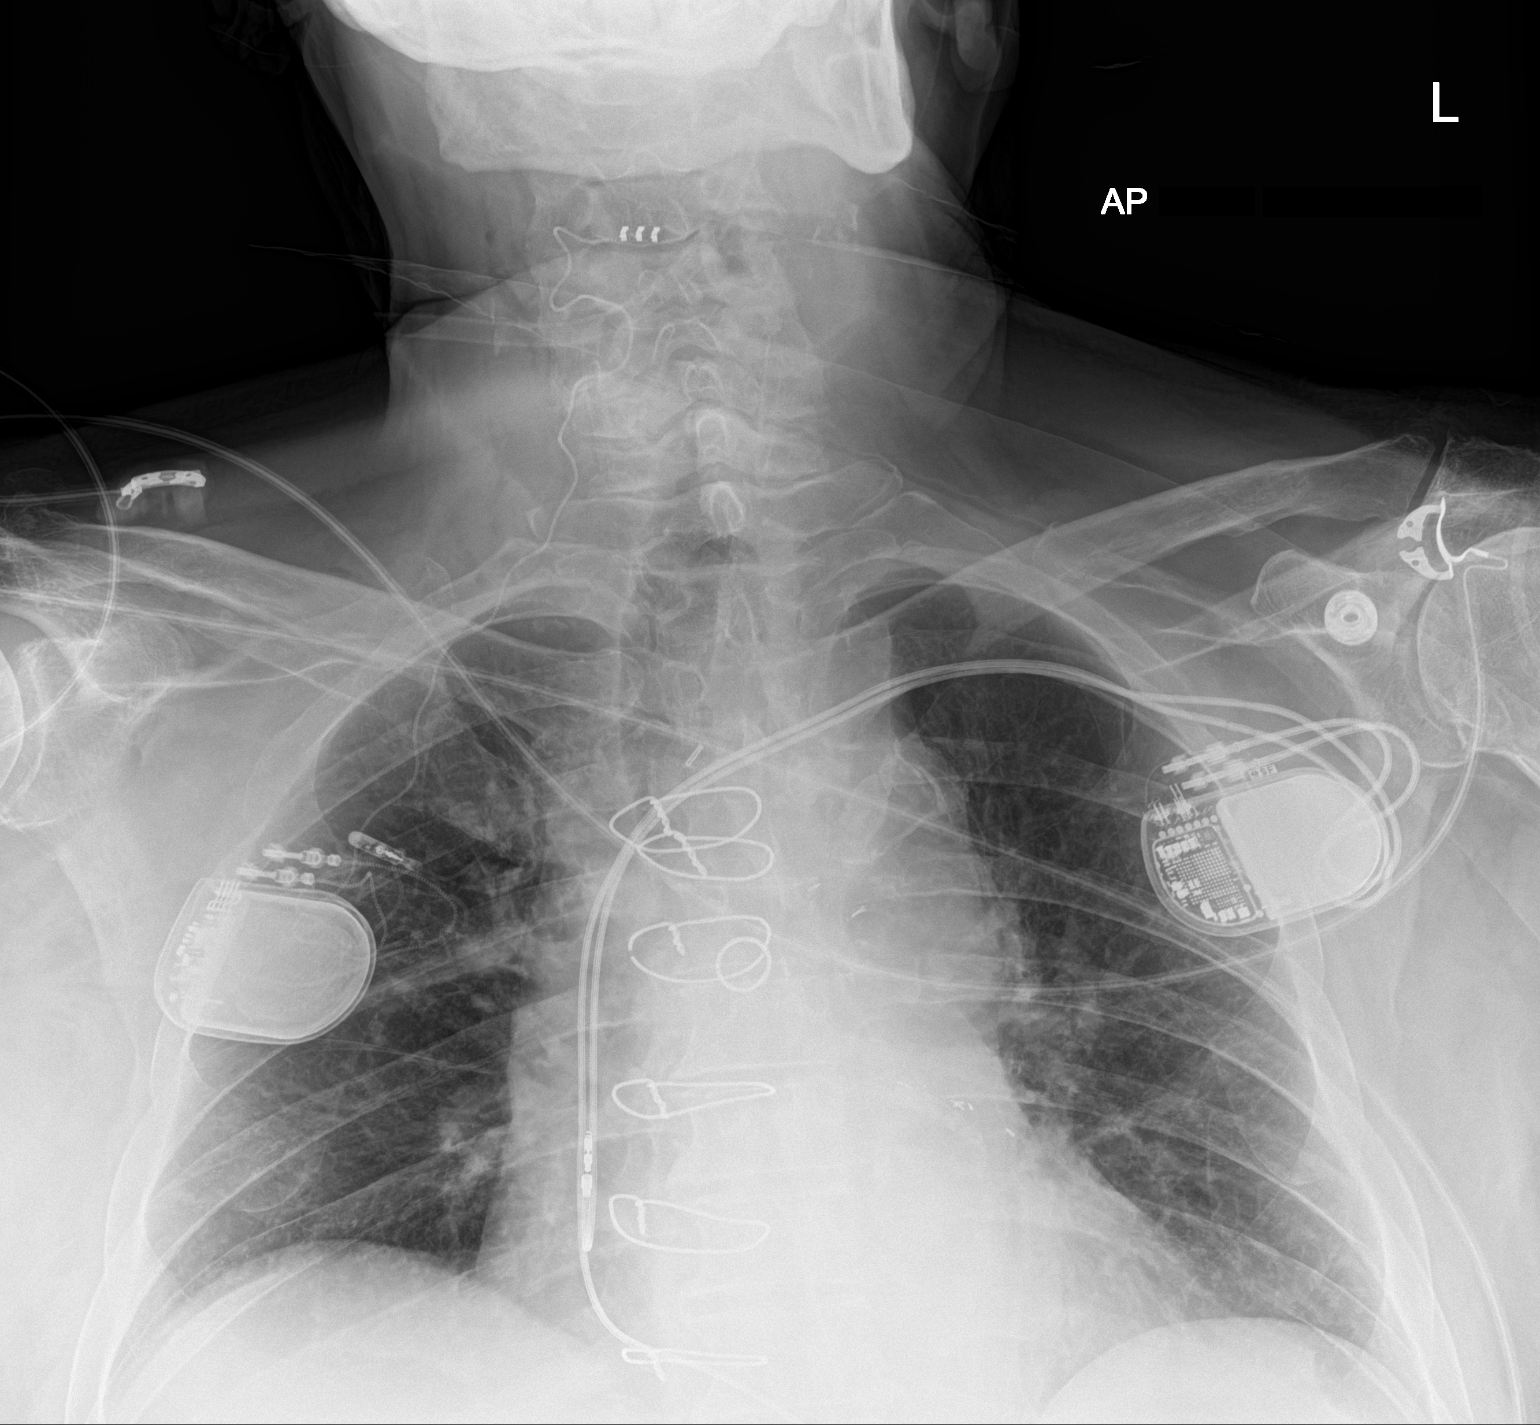

[2 of 2 positions shown; findings below may reference images not displayed]

FINDINGS: Previous median sternotomy and CABG. Dual lead pacemaker in place.
Mild chronic cardiomegaly and aortic atherosclerosis. The lungs are
clear. Neurostimulator generator overlies the right chest.
IMPRESSION: No active cardiopulmonary disease. Previous CABG. Pacemaker. Newly
placed neurostimulator overlies the right chest.

## 2021-01-31 ENCOUNTER — Other Ambulatory Visit: Payer: Self-pay

## 2021-01-31 ENCOUNTER — Other Ambulatory Visit: Payer: Self-pay | Admitting: Interventional Radiology

## 2021-01-31 DIAGNOSIS — N2889 Other specified disorders of kidney and ureter: Secondary | ICD-10-CM

## 2021-03-15 ENCOUNTER — Encounter: Payer: Self-pay | Admitting: *Deleted

## 2021-03-15 ENCOUNTER — Ambulatory Visit
Admission: RE | Admit: 2021-03-15 | Discharge: 2021-03-15 | Disposition: A | Discharge status: Home / Self Care | Payer: Medicare Other | Source: Ambulatory Visit | Attending: Interventional Radiology | Admitting: Interventional Radiology

## 2021-03-15 ENCOUNTER — Other Ambulatory Visit: Payer: Self-pay

## 2021-03-15 DIAGNOSIS — N2889 Other specified disorders of kidney and ureter: Secondary | ICD-10-CM

## 2021-03-15 HISTORY — PX: IR RADIOLOGIST EVAL & MGMT: IMG5224

## 2021-03-15 NOTE — Progress Notes (Signed)
Patient ID: Jake Woods, male   DOB: Mar 19, 1936, 85 y.o.   MRN: [id number]       Chief Complaint:  Status post right renal mass cryoablation and biopsy.  34-month follow-up  Referring Physician(s): Dr. Margo Aye  History of Present Illness: Jake Woods is a 85 y.o. male with several comorbidities including heart disease, previous bypass, pacemaker.  He underwent CT-guided cryoablation and biopsy 09/30/2019 at [facility] for a 3 cm exophytic upper pole solid renal mass.  Biopsy confirmed oncocytoma.  Today's visit is a telehealth visit for review of surveillance imaging.  Overall he continues to do very well.  Stable functional status.  No current flank or abdominal pain.  No other urinary tract symptoms.  No recent illness or fever.  Surveillance CT performed at [facility] demonstrates a stable ablation changes in the right kidney upper pole.  No ablation site marginal abnormality or enhancement.  No signs of residual or recurrent disease.  Stable renal cyst.  No new finding by CT.  Past Medical History:  Diagnosis Date  . Arthritis    hands  . Chronic back pain   . Coronary artery disease 1991   CABG at [facility]  . Diabetes mellitus without complication ([facility])   . Hepatitis    not sure which one  . Hypertension   . Memory deficits   . Presence of permanent cardiac pacemaker    SSS  . Sleep apnea    did not tolerate CPAP  . Spondylosis of lumbar spine   . Stroke [facility])    (post CABG per patient's record)  . Tremor     Past Surgical History:  Procedure Laterality Date  . CORONARY ARTERY BYPASS GRAFT  1991  . DRUG INDUCED ENDOSCOPY N/A 05/03/2020   Procedure: DRUG INDUCED ENDOSCOPY;  Surgeon: Christia Reading, MD;  Location: [facility];  Service: ENT;  Laterality: N/A;  . IMPLANTATION OF HYPOGLOSSAL NERVE STIMULATOR Right 06/14/2020   Procedure: IMPLANTATION OF HYPOGLOSSAL NERVE STIMULATOR;  Surgeon: Christia Reading, MD;  [facility];  Service: ENT;  Laterality:  Right;  . IR RADIOLOGIST EVAL & MGMT  07/28/2019  . IR RADIOLOGIST EVAL & MGMT  10/28/2019  . IR RADIOLOGIST EVAL & MGMT  02/15/2020  . LAPAROSCOPIC ABLATION RENAL MASS Right   . PACEMAKER INSERTION      Allergies: Ambien [zolpidem], Viagra [sildenafil], and Penicillins  Medications: Prior to Admission medications   Medication Sig Start Date End Date Taking? Authorizing Provider  aspirin EC 81 MG tablet Take 81 mg by mouth daily.    [provider]  atorvastatin (LIPITOR) 80 MG tablet Take 80 mg by mouth at bedtime.     [provider]  Cyanocobalamin (RA VITAMIN B12) 2000 MCG TBCR Take 2,000 mcg by mouth daily.    [provider]  donepezil (ARICEPT) 10 MG tablet Take 10 mg by mouth in the morning and at bedtime.     [provider]  ferrous sulfate 325 (65 FE) MG tablet Take 325 mg by mouth daily with breakfast. Patient not taking: Reported on 11/30/2020    [provider]  Glucosamine HCl 1500 MG TABS Take 1,500 mg by mouth in the morning and at bedtime.    [provider]  memantine (NAMENDA) 10 MG tablet Take 10 mg by mouth 2 (two) times daily.    [provider]  metoprolol succinate (TOPROL-XL) 50 MG 24 hr tablet Take 50 mg by mouth daily. Take with or immediately following  a meal.    [provider]  Multiple Vitamin (MULTIVITAMIN WITH MINERALS) TABS tablet Take 1 tablet by mouth daily. Chewable    [provider]  Omega-3 Fatty Acids (FISH OIL) 1200 MG CAPS Take 1,200 mg by mouth in the morning and at bedtime.    [provider]  potassium chloride (KLOR-CON) 10 MEQ tablet Take 10 mEq by mouth daily. 04/11/20   [provider]  sertraline (ZOLOFT) 25 MG tablet Take 25 mg by mouth daily.    [provider]  silodosin (RAPAFLO) 4 MG CAPS capsule Take 4 mg by mouth at bedtime.     [provider]  telmisartan (MICARDIS) 80 MG tablet Take 80 mg by mouth daily.    [provider]  tolterodine (DETROL LA) 4 MG 24 hr capsule Take 4 mg by mouth daily.    [provider]     No family history on file.  Social History   Socioeconomic History  . Marital status: Married    Spouse name: Not on file  . Number of children: Not on file  . Years of education: Not on file  . Highest education level: Not on file  Occupational History  . Not on file  Tobacco Use  . Smoking status: Former Smoker    Packs/day: 3.00    Years: 30.00    Pack years: 90.00    Quit date: 1984    Years since quitting: 38.3  . Smokeless tobacco: Never Used  Substance and Sexual Activity  . Alcohol use: Not Currently  . Drug use: Never  . Sexual activity: Not on file  Other Topics Concern  . Not on file  Social History Narrative   Lives with wife   Social Determinants of Health   Financial Resource Strain: Not on file  Food Insecurity: Not on file  Transportation Needs: Not on file  Physical Activity: Not on file  Stress: Not on file  Social Connections: Not on file     Review of Systems  Review of Systems: A 12 point ROS discussed and pertinent positives are indicated in the HPI above.  All other systems are negative.  Physical Exam No direct physical exam was performed, telephone health visit only today because of COVID pandemic Vital Signs: There were no vitals taken for this visit.  Imaging: No results found.  Labs:  CBC: Recent Labs    06/12/20 1148  WBC 9.6  HGB 13.3  HCT 42.0  PLT 184    COAGS: No results for input(s): INR, APTT in the last 8760 hours.  BMP: Recent Labs    05/01/20 1130 06/12/20 1148  NA 141 141  K 4.1 4.5  CL 111 111  CO2 23 23  GLUCOSE 115* 118*  BUN 25* 29*  CALCIUM 8.9 9.0  CREATININE 1.24 1.74*  GFRNONAA 54* 36*  GFRAA >60 41*    LIVER FUNCTION TESTS: Recent Labs    06/12/20 1148  BILITOT 0.8  AST 25  ALT 28  ALKPHOS 59  PROT 7.0  ALBUMIN 3.6     Assessment and Plan:  36-month  status post right renal mass biopsy and cryoablation.  Biopsy confirmed renal oncocytoma.  61-month surveillance imaging confirms stable ablation defect with no signs of residual or recurrent disease.  Ablation defect continues to retract.  No delayed complication.  Stable bilateral renal cyst.  Imaging findings reviewed with the patient today by telephone including his family.  All questions and concerns addressed.  Plan: Repeat outpatient CT without and with contrast in 1 year in [facility].  Electronically Signed: Berdine Dance 03/15/2021, 11:08 AM   I spent a total of    25 Minutes in remote  clinical consultation, greater than 50% of which was counseling/coordinating care for this patient with a renal oncocytoma status post cryoablation.    Visit type: Audio only (telephone). Audio (no video) only due to patient's lack of internet/smartphone capability. Alternative for in-person consultation at [facility], [address] [place], [place], [place]. This visit type was conducted due to national recommendations for restrictions regarding the COVID-19 Pandemic (e.g. social distancing).  This format is felt to be most appropriate for this patient at this time.  All issues noted in this document were discussed and addressed.

## 2021-05-31 ENCOUNTER — Ambulatory Visit (INDEPENDENT_AMBULATORY_CARE_PROVIDER_SITE_OTHER): Payer: Self-pay | Admitting: Neurology

## 2021-05-31 NOTE — Progress Notes (Signed)
Error

## 2021-06-28 ENCOUNTER — Ambulatory Visit: Payer: Medicare Other | Admitting: Neurology

## 2021-07-24 ENCOUNTER — Telehealth: Payer: Self-pay

## 2021-07-24 ENCOUNTER — Ambulatory Visit (INDEPENDENT_AMBULATORY_CARE_PROVIDER_SITE_OTHER): Payer: Medicare Other | Admitting: Neurology

## 2021-07-24 ENCOUNTER — Encounter: Payer: Self-pay | Admitting: Neurology

## 2021-07-24 VITALS — BP 139/79 | HR 77 | Ht 64.0 in | Wt 172.6 lb

## 2021-07-24 DIAGNOSIS — Z9682 Presence of neurostimulator: Secondary | ICD-10-CM

## 2021-07-24 DIAGNOSIS — Z789 Other specified health status: Secondary | ICD-10-CM | POA: Diagnosis not present

## 2021-07-24 DIAGNOSIS — G4733 Obstructive sleep apnea (adult) (pediatric): Secondary | ICD-10-CM | POA: Diagnosis not present

## 2021-07-24 NOTE — Patient Instructions (Signed)
It was good to see you again today.   As discussed, we have increased your stimulation to the next level up, which is level 4, 3.5 V.  Please continue nightly with this setting.  You have been using your inspire very well.  Let's keep your start delay at 30 minutes and your pause at 15 minutes.  Total time for treatment is set at 8 hours.  I recommend you follow-up in about 6 months.  Please call us in the interim with any questions or concerns.  As discussed, we will go ahead and schedule you for a home sleep test.  He will take testing equipment home and please use your inspire treatment that night as usual as well so we can see how effective it is.  We will call you with the results and make adjustments as needed.

## 2021-07-24 NOTE — Progress Notes (Signed)
Subjective:    Patient ID: Jake Woods is a 85 y.o. male.  HPI    Interim history:   Jake Woods is an 85 year old right-handed gentleman with an underlying medical history of arthritis, chronic back pain, coronary artery disease with status post bypass in 1991, sick sinus syndrome with status post pacemaker placement some 15 years ago, history of stroke, tremor, memory loss (followed by neurology in [facility]), and overweight state, who presents for follow-up consultation of his obstructive sleep apnea, on Inspire treatment.  He is accompanied by his daughter again today. [employer], Jake Woods, joins [place] for the visit as well.  I last saw him for his inspire adjustment appointment after his Inspire titration study on 11/30/20, at which time he was doing fairly well.  He was encouraged to continue to titrate up and we changed his level of stimulation from 3.3 V to 3.4 V at the time.  Today, 07/24/2021: He reports doing well, no issues using his inspire, his incoming voltage is 3.4, level 3.  He did not increase it after our last visit.  He has been using his inspire consistently with the exception of 2 missed days in the past 3 weeks.  His average usage is 8.5 hours.  Start delay is at 30 minutes.  He has had no medication changes, he has a routine checkup pending for his cardiology follow-up.  He has regular remote pacer checks.     The patient's allergies, current medications, family history, past medical history, past social history, past surgical history and problem list were reviewed and updated as appropriate.    Previously:     I saw him on 07/27/20 for is Inspire activation. He had the hypoglossal nerve stimulator placed on 06/14/2020 without any problem under the care of Dr. Melida Quitter. He has a history of CPAP intolerance for his moderate obstructive sleep apnea.  His initial activation went well, he was set to an initial amplitude of 2.4 V with the goal to titrate over the next 3  months to up to 3.4 V. His start delay was 30 minutes, 8 hours of therapy time and pause time of 15 minutes. He was scheduled for a titration study.    He had an Inspire titration study on 10/30/20. His overall sleep efficiency was 84.4%, sleep latency 1.5 minutes, REM latency 32.5 minutes.  He did have significant sleep fragmentation especially later in the night.  It was a somewhat difficult titration.  Recommended therapeutic pressure was 3.5 V for now. The patient's incoming amplitude was 3.3 V.  Inspire was titrated to maximum amplitude of 4.1 V.  His therapeutic amplitude was found to be around 3.5 V at which point his AHI was estimated to be 14/h. After completion of the study the patient was programmed back to the incoming amplitude of 3.3 V.  Lower limit was programmed to be 0.2 V below the incoming amplitude and upper limit was programmed to be 1 V above the lower limit. He was advised that the final settings will be programmed after his follow-up visit.        I first met him on 07/26/2020 at the request of Dr. Melida Quitter in ENT, at which time the patient reported a prior diagnosis of sleep apnea and intolerance to CPAP therapy in the past.  He had evaluation through ENT for inspire candidacy and had a hypoglossal nerve stimulator implanted successfully in July 2021.      07/26/20: (He) was diagnosed with obstructive  sleep apnea but could not tolerate CPAP.  He had a sleep study in [facility] in March 2019 which showed an AHI of 27.2/h, O2 nadir of 78%. He reports that he tried CPAP for about a month.  He gave the machine back.  He has not been sleeping well.  His snoring bothers his wife but his wife's CPAP also bothers him.  He tries to be in bed around 10 PM, rise time is generally between 6 and 7.  He has 2 brothers with sleep apnea and 1 son with sleep apnea as well.  He does take naps during the day, stays in his recliner and falls asleep inadvertently.  They have 2 dogs and 1 cat in the  household.  He drinks no daily caffeine, soda occasionally, tea occasionally.  He quit smoking some 35 years ago and drinks alcohol rarely.  He had inspire evaluation and I reviewed your records.  He had the implantation on 06/14/2020.  He has healed well.  He did have a fall the day after he was released home.  He had to get checked out in [facility] in the emergency room. I reviewed the emergency room records from 06/15/2020.  He had a head CT and cervical spine CT without contrast and I reviewed the results: IMPRESSION: 1. Mild diffuse cortical atrophy. Mild chronic ischemic white matter disease. No acute intracranial abnormality seen. 2. Multilevel degenerative disc disease. No acute abnormality seen in the cervical spine.   He also had a chest x-ray without any acute cardiopulmonary disease seen.   His Epworth sleepiness score is 14 out of 24, fatigue severity score is 14 out of 63.  His Past Medical History Is Significant For: Past Medical History:  Diagnosis Date   Arthritis    hands   Chronic back pain    Coronary artery disease 1991   CABG at [facility]   Diabetes mellitus without complication ([place])    Hepatitis    not sure which one   Hypertension    Memory deficits    Presence of permanent cardiac pacemaker    SSS   Sleep apnea    did not tolerate CPAP   Spondylosis of lumbar spine    Stroke ([place])    (post CABG per patient's record)   Tremor     His Past Surgical History Is Significant For: Past Surgical History:  Procedure Laterality Date   CORONARY ARTERY BYPASS GRAFT  1991   DRUG INDUCED ENDOSCOPY N/A 05/03/2020   Procedure: DRUG INDUCED ENDOSCOPY;  Surgeon: Melida Quitter, MD;  Location: [place];  Service: ENT;  Laterality: N/A;   IMPLANTATION OF HYPOGLOSSAL NERVE STIMULATOR Right 06/14/2020   Procedure: IMPLANTATION OF HYPOGLOSSAL NERVE STIMULATOR;  Surgeon: Melida Quitter, MD;  Location: [place];  Service: ENT;  Laterality: Right;   IR RADIOLOGIST EVAL &  MGMT  07/28/2019   IR RADIOLOGIST EVAL & MGMT  10/28/2019   IR RADIOLOGIST EVAL & MGMT  02/15/2020   IR RADIOLOGIST EVAL & MGMT  03/15/2021   LAPAROSCOPIC ABLATION RENAL MASS Right    PACEMAKER INSERTION      His Family History Is Significant For: History reviewed. No pertinent family history.  His Social History Is Significant For: Social History   Socioeconomic History   Marital status: Married    Spouse name: Not on file   Number of children: Not on file   Years of education: Not on file   Highest education level: Not on file  Occupational History   Not on file  Tobacco Use   Smoking status: Former    Packs/day: 3.00    Years: 30.00    Pack years: 90.00    Types: Cigarettes    Quit date: 85    Years since quitting: 38.7   Smokeless tobacco: Never  Substance and Sexual Activity   Alcohol use: Not Currently   Drug use: Never   Sexual activity: Not on file  Other Topics Concern   Not on file  Social History Narrative   Lives with wife   Social Determinants of Health   Financial Resource Strain: Not on file  Food Insecurity: Not on file  Transportation Needs: Not on file  Physical Activity: Not on file  Stress: Not on file  Social Connections: Not on file    His Allergies Are:  Allergies  Allergen Reactions   Ambien [Zolpidem] Other (See Comments)    confusion   Viagra [Sildenafil] Other (See Comments)    flushing   Penicillins Rash and Other (See Comments)    Welts.  :   His Current Medications Are:  Outpatient Encounter Medications as of 07/24/2021  Medication Sig   aspirin EC 81 MG tablet Take 81 mg by mouth daily.   atorvastatin (LIPITOR) 80 MG tablet Take 80 mg by mouth at bedtime.    Cyanocobalamin (RA VITAMIN B12) 2000 MCG TBCR Take 2,000 mcg by mouth daily.   donepezil (ARICEPT) 10 MG tablet Take 10 mg by mouth in the morning and at bedtime.    Glucosamine HCl 1500 MG TABS Take 1,500 mg by mouth in the morning and at bedtime.   memantine  (NAMENDA) 10 MG tablet Take 10 mg by mouth 2 (two) times daily.   metoprolol succinate (TOPROL-XL) 50 MG 24 hr tablet Take 50 mg by mouth daily. Take with or immediately following a meal.   Multiple Vitamin (MULTIVITAMIN WITH MINERALS) TABS tablet Take 1 tablet by mouth daily. Chewable   Omega-3 Fatty Acids (FISH OIL) 1200 MG CAPS Take 1,200 mg by mouth in the morning and at bedtime.   potassium chloride (KLOR-CON) 10 MEQ tablet Take 10 mEq by mouth daily.   sertraline (ZOLOFT) 25 MG tablet Take 25 mg by mouth daily.   silodosin (RAPAFLO) 4 MG CAPS capsule Take 4 mg by mouth at bedtime.    telmisartan (MICARDIS) 80 MG tablet Take 80 mg by mouth daily.   tolterodine (DETROL LA) 4 MG 24 hr capsule Take 4 mg by mouth daily.   No facility-administered encounter medications on file as of 07/24/2021.  :  Review of Systems:  Out of a complete 14 point review of systems, all are reviewed and negative with the exception of these symptoms as listed below:  Review of Systems  Neurological:        Pt is at sleep lab with daughter for follow up visit for inspire    Objective:  Neurological Exam  Physical Exam Physical Examination:   Vitals:   07/24/21 0710  BP: 139/79  Pulse: 77   General Examination: The patient is a very pleasant 85 y.o. male in no acute distress. He appears well-developed and well-nourished and well groomed.   HEENT: Normocephalic, atraumatic, tracking is well preserved. Face is symmetric with normal facial animation. Speech is clear with no dysarthria noted. Well-healed scar under the right side of the chin.     Chest:  Stimulation and sensing went well.  Chest clear to auscultation, unremarkable midline scar on the  chest, unremarkable pacemaker site, unremarkable inspire site on the right upper chest.   Abdomen: Soft, non-tender and non-distended.   Extremities: There is no obvious edema in the distal lower extremities bilaterally.    Skin: Warm and dry without trophic  changes noted.    Musculoskeletal: exam reveals no obvious joint deformities.    Neurologically:  Mental status: The patient is awake, alert and cooperative. Mood is normal and affect is normal.  Cranial nerves II - XII are as described above under HEENT exam.  Motor exam: Normal bulk, normal movements of all 4 extr. He does have an intermittent tremor in the upper extremities. Fine motor skills and coordination: grossly intact for age.  Cerebellar testing: No dysmetria or intention tremor. There is no truncal or gait ataxia.  Sensory exam: intact to light touch in the upper and lower extremities.    Assessment and Plan:  In summary, Jake Woods is a very pleasant 85 year old male with an underlying complex medical history of arthritis, chronic back pain, coronary artery disease with status post bypass in 1991, sick sinus syndrome with status post pacemaker placement some 15 years ago, mildly overweight state, history of stroke, tremor, and memory loss, for which he sees neurology in [facility], who presents for follow-up consultation of his obstructive sleep apnea, on Inspire stimulator treatment. He had an Inspire titration sleep study on 10/30/2020. From the inspire standpoint he did well during the study, but he did have sleep disruption later at night, during the second half of the study he had significant sleep fragmentation.  Currently, he sleeps quite well.  His average usage time is 8-1/2 hours.  His incoming voltage is 3.4, we increased his stimulation to the next level which is level 4, 3.5 V, this was based on his titration results from December 2021.  He was agreeable with this change.  I would like for him to go ahead and schedule a home sleep test so we can review treatment efficacy while he is on inspire treatment at home.  We will call with the home sleep test results and plan a follow-up accordingly, for now, we will schedule a routine follow-up in 6 months, sooner if needed.  We will  tweak the appointment depending on the home sleep test report.  He has done quite well, he did not have any additional questions.   Of note, he had the hypoglossal nerve stimulator placed on 06/14/2020 without any problem under the care of Dr. Melida Quitter.  He has a history of CPAP intolerance for his moderate obstructive sleep apnea.  His initial activation went well on 07/27/2020.  Initial amplitude was 2.4 V with the goal to titrate in 3 months to up to 3.4 V. we mutually agreed to keep the start delay at 30 minutes, therapy time at 8 hours and pause time and 15 minutes.  I spent 30 minutes in total face-to-face time and in reviewing records during pre-charting, more than 50% of which was spent in counseling and coordination of care, reviewing test results, reviewing medications and treatment regimen and/or in discussing or reviewing the diagnosis of OSA, the prognosis and treatment options. Pertinent laboratory and imaging test results that were available during this visit with the patient were reviewed by me and considered in my medical decision making (see chart for details).

## 2021-09-19 ENCOUNTER — Ambulatory Visit: Payer: Medicare Other | Admitting: Neurology

## 2021-09-19 DIAGNOSIS — G4733 Obstructive sleep apnea (adult) (pediatric): Secondary | ICD-10-CM

## 2021-09-19 DIAGNOSIS — Z9682 Presence of neurostimulator: Secondary | ICD-10-CM

## 2021-09-19 DIAGNOSIS — Z789 Other specified health status: Secondary | ICD-10-CM

## 2022-01-17 ENCOUNTER — Telehealth: Payer: Self-pay | Admitting: *Deleted

## 2022-01-17 NOTE — Telephone Encounter (Signed)
Patient had a 3-month follow-up scheduled tentatively for this coming Monday, March 6.  He also was supposed to have an HST.  I spoke with our sleep lab manager.  The plan will be to schedule an HST for next week and hold on this visit for now.  I called the patient's daughter, Lyla Son (on [place]), and LVM (ok per DPR) advising of this. Advised we would cancel pt's appt for Monday 3/6 and they would receive a separate call to schedule the HST.  ? ?Spoke with [employer]. She has provided her direct number for pt's daughter to call to schedule the HST. Sent mychart message to pt. Canceled Monday's appt. Will try to call them once more later this afternoon.  ?

## 2022-01-21 ENCOUNTER — Ambulatory Visit: Payer: Medicare Other | Admitting: Neurology

## 2022-01-30 ENCOUNTER — Telehealth: Payer: Self-pay

## 2022-01-30 NOTE — Telephone Encounter (Signed)
LVM for pt's daughter to call me back to schedule pt's HST ?

## 2022-02-04 ENCOUNTER — Other Ambulatory Visit: Payer: Self-pay | Admitting: Interventional Radiology

## 2022-02-04 ENCOUNTER — Other Ambulatory Visit: Payer: Self-pay

## 2022-02-04 DIAGNOSIS — N2889 Other specified disorders of kidney and ureter: Secondary | ICD-10-CM

## 2022-03-25 ENCOUNTER — Telehealth: Payer: Self-pay | Admitting: Neurology

## 2022-03-25 NOTE — Telephone Encounter (Signed)
Pt daughter(on DPR) wanted to schedule appt with Dr. Frances Furbish due to pt's headaches occurring in the middle of night and in the mornings.  ?Pt believes it may have something to do with his Inspire.  ?Pt would also like to complete an at home sleep study.  ?

## 2022-03-25 NOTE — Telephone Encounter (Signed)
Spoke to daughter of pt who is caregiver to both her her parents.  I relayed that speaking to Dr. Rexene Alberts, prior to calling she is stating that having a HST would need to be done to assess if OSA is issue causing headaches. Daughter stated that pt has had headaches for the last week , saw pcp 2 wks prior to that.  Pt gets up in the middle of night , notes headache, then also in morning. He relates to inspire.  Location of headache she did not know.  Or taking any otc's not aware. When inspire turned off (headache goes away.  I spoke to daughter after speaking to alia and Alexis in sleep lab.  Pt scheduled 4pm 04-01-2022 carrie to pick up device.  He did have HST 09-19-2022 but did not take?  (This would be a repeat).  Her work schedule is one M-F 8-5, but will make 04-01-2022 4p appt to get HST to try again.  I relayed if headaches get worse/severe seek care at urgent care/ED.  She verbalized understanding and appreciation for call.  ?

## 2022-04-01 ENCOUNTER — Ambulatory Visit (INDEPENDENT_AMBULATORY_CARE_PROVIDER_SITE_OTHER): Payer: Medicare Other | Admitting: Neurology

## 2022-04-01 DIAGNOSIS — G4733 Obstructive sleep apnea (adult) (pediatric): Secondary | ICD-10-CM

## 2022-04-01 DIAGNOSIS — Z9682 Presence of neurostimulator: Secondary | ICD-10-CM

## 2022-04-01 DIAGNOSIS — Z789 Other specified health status: Secondary | ICD-10-CM

## 2022-04-03 NOTE — Progress Notes (Signed)
See procedure note.

## 2022-04-17 NOTE — Patient Instructions (Signed)
Left another voicemail for daughter to give me a call back. Want to get a download off patient's Inspire remote.

## 2022-05-14 ENCOUNTER — Ambulatory Visit: Payer: Medicare Other | Admitting: Neurology

## 2022-05-20 NOTE — Procedures (Signed)
   [facility]  HOME SLEEP TEST (Watch PAT) REPORT  STUDY DATE: 04/01/2022  DOB: 30-Dec-1935  MRN: [id number]  ORDERING CLINICIAN: [name], MD, PhD   CLINICAL INFORMATION/HISTORY: 86 year old right-handed gentleman with an underlying medical history of arthritis, chronic back pain, coronary artery disease with status post bypass in 1991, sick sinus syndrome with status post pacemaker placement some 15 years ago, history of stroke, tremor, memory loss, and overweight state, who presents for assessment of his residual sleep apnea, on Inspire treatment.  Reportedly, patient used his Inspire device the night of his home sleep test.  Epworth sleepiness score: 14/24.  BMI: 29.4 kg/m  FINDINGS:   Sleep Summary:   Total Recording Time (hours, min): 10 hours, 0 min  Total Sleep Time (hours, min):  8 hours, 18 min  Percent REM (%):    16.9%   Respiratory Indices:   Calculated pAHI (per hour):  54.4/hour         REM pAHI:    54.5/hour       NREM pAHI: 54.4/hour  Central pAHI: 5.9/hour  Oxygen Saturation Statistics:    Oxygen Saturation (%) Mean: 93%   Minimum oxygen saturation (%):                 81%   O2 Saturation Range (%): 81-99%    O2 Saturation (minutes) <=88%: 8.7 min  Pulse Rate Statistics:   Pulse Mean (bpm):    70/min    Pulse Range (38-143/min)   IMPRESSION: OSA (obstructive sleep apnea), severe  RECOMMENDATION:  This home sleep test demonstrates severe residual obstructive sleep apnea - with Inspire treatment - with a total AHI of 54.4/hour and O2 nadir of 81%.  An IT trainer was not possible for the night, as the patient did not return for a device download.  The patient reportedly used his device that night.  Mild to moderate snoring was detected, at times in the louder range.  Treatment adjustments on his Inspire device are recommended.  A laboratory attended titration study can be considered.  Please note, that untreated  obstructive sleep apnea may carry additional perioperative morbidity. Patients with significant obstructive sleep apnea should receive perioperative PAP therapy and the surgeons and particularly the anesthesiologist should be informed of the diagnosis and the severity of the sleep disordered breathing. The patient should be cautioned not to drive, work at heights, or operate dangerous or heavy equipment when tired or sleepy. Review and reiteration of good sleep hygiene measures should be pursued with any patient. Other causes of the patient's symptoms, including circadian rhythm disturbances, an underlying mood disorder, medication effect and/or an underlying medical problem cannot be ruled out based on this test. Clinical correlation is recommended. The patient and his referring provider will be notified of the test results. The patient will be seen in follow up in sleep clinic at [facility].  I certify that I have reviewed the raw data recording prior to the issuance of this report in accordance with the standards of the American Academy of Sleep Medicine (AASM).  INTERPRETING PHYSICIAN:   [name], MD, PhD  Board Certified in Neurology and Sleep Medicine  [facility] [address], [address] [place], [place] [address] [phone]

## 2022-05-23 ENCOUNTER — Telehealth: Payer: Self-pay

## 2022-05-23 NOTE — Telephone Encounter (Signed)
-----   Message from [name], MD sent at 05/20/2022  5:22 PM EDT ----- Patient had a home sleep test on 04/01/2022 which shows residual sleep apnea in the severe range despite using inspire.  He reportedly used his inspire that night but a actual download from that night was not possible as he has not returned for an inspire download.  I was waiting to finish this report once we have a download for that night.  Please advise patient to make a follow-up appointment in the sleep clinic so we can make further adjustments to his inspire and consider a titration study overnight in the sleep lab as well.

## 2022-05-23 NOTE — Telephone Encounter (Signed)
I called pt and spoke with his daughter ( ok per dpr) and went over the results. She verbalized understanding and pt has f/u scheduled for 06/13/2022. Will discuss further at up coming f/u.

## 2022-06-13 ENCOUNTER — Encounter: Payer: Self-pay | Admitting: Neurology

## 2022-06-13 ENCOUNTER — Ambulatory Visit (INDEPENDENT_AMBULATORY_CARE_PROVIDER_SITE_OTHER): Payer: Medicare Other | Admitting: Neurology

## 2022-06-13 ENCOUNTER — Telehealth: Payer: Self-pay

## 2022-06-13 VITALS — BP 155/89 | HR 87 | Ht 62.0 in | Wt 169.0 lb

## 2022-06-13 DIAGNOSIS — G4733 Obstructive sleep apnea (adult) (pediatric): Secondary | ICD-10-CM

## 2022-06-13 DIAGNOSIS — Z9682 Presence of neurostimulator: Secondary | ICD-10-CM | POA: Diagnosis not present

## 2022-06-13 NOTE — Progress Notes (Signed)
Subjective:    Patient ID: Jake Woods is a 86 y.o. male.  HPI    Interim history:   Jake Woods is an 86 year old right-handed gentleman with an underlying medical history of arthritis, chronic back pain, coronary artery disease with status post bypass in 1991, sick sinus syndrome with status post pacemaker placement some 15 years ago, history of stroke, tremor, memory loss (followed by neurology in [facility]), and overweight state, who presents for follow-up consultation of his obstructive sleep apnea, on Inspire treatment.  He is accompanied by his daughter again today. The [employer], Jake Woods, joins [place] for the visit, as well as our sleep Management consultant, Jake Woods. I last saw him on 07/24/2021, at which time he was using inspire but had not increased the voltage as discussed during the previous visit.  His stimulation level was increased to 3.5 V.  He was advised to proceed with a home sleep test for recheck on the efficacy of his inspire.  He had a home sleep test on 04/01/2022 which indicated severe residual obstructive sleep apnea reportedly while using his inspire, total AHI was 54.4/h, O2 nadir 81%.  We tried to get in touch with him to get an inspire download for that night but were not successful in getting him to return to the office with a remote to review a download for that date.   Today, 06/13/2022: I reviewed his inspire compliance download from the past 6 months from 10/09/2021 through Apr 08, 2021, during which time he used his Jake Woods about 53% of the time.  He is at 3.5 V.  He reports that he is not consistent with his bedtime, he likes to watch sports.  He if he watches something on TV he may go to bed after midnight.  If he does not watch anything specific he may go to bed as early as 830 or 9 PM.  He does admit to forgetting to turn it on, he is frustrated today as he has difficulty communicating, finding the right words.  He has good comprehension.  He does have word finding  difficulty.  We turned on the inspire in different settings, his original configuration A was compared against B and C.  He had good tongue protrusion without discomfort on setting B with range of 0.8 to 1.2 V.  We set his level at level 2, 0.9 V, with 4 additional levels up and 1 level down.   The patient's allergies, current medications, family history, past medical history, past social history, past surgical history and problem list were reviewed and updated as appropriate.    Previously:    I saw him for his inspire adjustment appointment after his Inspire titration study on 11/30/20, at which time he was doing fairly well.  He was encouraged to continue to titrate up and we changed his level of stimulation from 3.3 V to 3.4 V at the time.     I saw him on 07/27/20 for is Inspire activation. He had the hypoglossal nerve stimulator placed on 06/14/2020 without any problem under the care of Jake Woods. He has a history of CPAP intolerance for his moderate obstructive sleep apnea.  His initial activation went well, he was set to an initial amplitude of 2.4 V with the goal to titrate over the next 3 months to up to 3.4 V. His start delay was 30 minutes, 8 hours of therapy time and pause time of 15 minutes. He was scheduled for a titration study.  He had an Inspire titration study on 10/30/20. His overall sleep efficiency was 84.4%, sleep latency 1.5 minutes, REM latency 32.5 minutes.  He did have significant sleep fragmentation especially later in the night.  It was a somewhat difficult titration.  Recommended therapeutic pressure was 3.5 V for now. The patient's incoming amplitude was 3.3 V.  Inspire was titrated to maximum amplitude of 4.1 V.  His therapeutic amplitude was found to be around 3.5 V at which point his AHI was estimated to be 14/h. After completion of the study the patient was programmed back to the incoming amplitude of 3.3 V.  Lower limit was programmed to be 0.2 V below the  incoming amplitude and upper limit was programmed to be 1 V above the lower limit. He was advised that the final settings will be programmed after his follow-up visit.        I first met him on 07/26/2020 at the request of Jake Woods in ENT, at which time the patient reported a prior diagnosis of sleep apnea and intolerance to CPAP therapy in the past.  He had evaluation through ENT for inspire candidacy and had a hypoglossal nerve stimulator implanted successfully in July 2021.      07/26/20: (He) was diagnosed with obstructive sleep apnea but could not tolerate CPAP.  He had a sleep study in [facility] in March 2019 which showed an AHI of 27.2/h, O2 nadir of 78%. He reports that he tried CPAP for about a month.  He gave the machine back.  He has not been sleeping well.  His snoring bothers his wife but his wife's CPAP also bothers him.  He tries to be in bed around 10 PM, rise time is generally between 6 and 7.  He has 2 brothers with sleep apnea and 1 son with sleep apnea as well.  He does take naps during the day, stays in his recliner and falls asleep inadvertently.  They have 2 dogs and 1 cat in the household.  He drinks no daily caffeine, soda occasionally, tea occasionally.  He quit smoking some 35 years ago and drinks alcohol rarely.  He had inspire evaluation and I reviewed your records.  He had the implantation on 06/14/2020.  He has healed well.  He did have a fall the day after he was released home.  He had to get checked out in [facility] in the emergency room. I reviewed the emergency room records from 06/15/2020.  He had a head CT and cervical spine CT without contrast and I reviewed the results: IMPRESSION: 1. Mild diffuse cortical atrophy. Mild chronic ischemic white matter disease. No acute intracranial abnormality seen. 2. Multilevel degenerative disc disease. No acute abnormality seen in the cervical spine.   He also had a chest x-ray without any acute cardiopulmonary disease  seen.   His Epworth sleepiness score is 14 out of 24, fatigue severity score is 14 out of 63.  His Past Medical History Is Significant For: Past Medical History:  Diagnosis Date   Arthritis    hands   Chronic back pain    Coronary artery disease 1991   CABG at [facility]   Diabetes mellitus without complication ([place])    Hepatitis    not sure which one   Hypertension    Memory deficits    Presence of permanent cardiac pacemaker    SSS   Sleep apnea    did not tolerate CPAP   Spondylosis of lumbar spine  Stroke [facility])    (post CABG per patient's record)   Tremor     His Past Surgical History Is Significant For: Past Surgical History:  Procedure Laterality Date   CORONARY ARTERY BYPASS GRAFT  1991   DRUG INDUCED ENDOSCOPY N/A 05/03/2020   Procedure: DRUG INDUCED ENDOSCOPY;  Surgeon: Melida Quitter, MD;  Location: [place];  Service: ENT;  Laterality: N/A;   IMPLANTATION OF HYPOGLOSSAL NERVE STIMULATOR Right 06/14/2020   Procedure: IMPLANTATION OF HYPOGLOSSAL NERVE STIMULATOR;  Surgeon: Melida Quitter, MD;  Location: [place];  Service: ENT;  Laterality: Right;   IR RADIOLOGIST EVAL & MGMT  07/28/2019   IR RADIOLOGIST EVAL & MGMT  10/28/2019   IR RADIOLOGIST EVAL & MGMT  02/15/2020   IR RADIOLOGIST EVAL & MGMT  03/15/2021   LAPAROSCOPIC ABLATION RENAL MASS Right    PACEMAKER INSERTION      His Family History Is Significant For: Family History  Problem Relation Age of Onset   Sleep apnea Brother     His Social History Is Significant For: Social History   Socioeconomic History   Marital status: Married    Spouse name: Not on file   Number of children: Not on file   Years of education: Not on file   Highest education level: Not on file  Occupational History   Not on file  Tobacco Use   Smoking status: Former    Packs/day: 3.00    Years: 30.00    Total pack years: 90.00    Types: Cigarettes    Quit date: 31    Years since quitting: 39.5   Smokeless  tobacco: Never  Substance and Sexual Activity   Alcohol use: Not Currently   Drug use: Never   Sexual activity: Not on file  Other Topics Concern   Not on file  Social History Narrative   Lives with wife   Social Determinants of Health   Financial Resource Strain: Not on file  Food Insecurity: Not on file  Transportation Needs: Not on file  Physical Activity: Not on file  Stress: Not on file  Social Connections: Not on file    His Allergies Are:  Allergies  Allergen Reactions   Ambien [Zolpidem] Other (See Comments)    confusion   Viagra [Sildenafil] Other (See Comments)    flushing   Penicillins Rash and Other (See Comments)    Welts.  :   His Current Medications Are:  Outpatient Encounter Medications as of 06/13/2022  Medication Sig   aspirin EC 81 MG tablet Take 81 mg by mouth daily.   atorvastatin (LIPITOR) 80 MG tablet Take 80 mg by mouth at bedtime.    Cyanocobalamin (RA VITAMIN B12) 2000 MCG TBCR Take 2,000 mcg by mouth daily.   donepezil (ARICEPT) 10 MG tablet Take 10 mg by mouth in the morning and at bedtime.    Glucosamine HCl 1500 MG TABS Take 1,500 mg by mouth in the morning and at bedtime.   memantine (NAMENDA) 10 MG tablet Take 10 mg by mouth 2 (two) times daily.   metoprolol succinate (TOPROL-XL) 50 MG 24 hr tablet Take 50 mg by mouth daily. Take with or immediately following a meal.   Multiple Vitamin (MULTIVITAMIN WITH MINERALS) TABS tablet Take 1 tablet by mouth daily. Chewable   Omega-3 Fatty Acids (FISH OIL) 1200 MG CAPS Take 1,200 mg by mouth in the morning and at bedtime.   potassium chloride (KLOR-CON) 10 MEQ tablet Take 10 mEq by mouth daily.  sertraline (ZOLOFT) 25 MG tablet Take 25 mg by mouth daily.   silodosin (RAPAFLO) 4 MG CAPS capsule Take 4 mg by mouth at bedtime.    telmisartan (MICARDIS) 80 MG tablet Take 80 mg by mouth daily.   tolterodine (DETROL LA) 4 MG 24 hr capsule Take 4 mg by mouth daily.   No facility-administered encounter  medications on file as of 06/13/2022.  :  Review of Systems:  Out of a complete 14 point review of systems, all are reviewed and negative with the exception of these symptoms as listed below:  Review of Systems  Neurological:        Pt here for inspire follow up  ESS:9 FSS:13    Objective:  Neurological Exam  Physical Exam Physical Examination:   Vitals:   06/13/22 0747  BP: (!) 155/89  Pulse: 87    General Examination: The patient is a very pleasant 86 y.o. male in no acute distress. He appears well-developed and well-nourished and well groomed.   HEENT: Normocephalic, atraumatic, tracking is well preserved. Face is symmetric with normal facial animation. Speech is clear with no dysarthria noted. Well-healed scar under the right side of the chin.     Chest:  Stimulation and sensing went well.  Chest clear to auscultation, unremarkable midline scar on the chest, unremarkable pacemaker site, unremarkable inspire site on the right upper chest.   Abdomen: Soft, non-tender and non-distended.   Extremities: There is no obvious edema in the distal lower extremities bilaterally, varicose veins noted in the distal lower extremities bilaterally.    Skin: Warm and dry without trophic changes noted.    Musculoskeletal: exam reveals no obvious joint deformities.    Neurologically:  Mental status: The patient is awake, alert and cooperative.  Comprehension is good, he has word finding difficulty.  Mood is normal and affect is normal.  Cranial nerves II - XII are as described above under HEENT exam.  Motor exam: Normal bulk, normal movements of all 4 extr. He does have an intermittent mild to moderate tremor in the upper extremities bilaterally. Fine motor skills and coordination: grossly intact for age.  Cerebellar testing: No dysmetria or intention tremor. There is no truncal or gait ataxia.  Sensory exam: intact to light touch in the upper and lower extremities.    Assessment and  Plan:  In summary, Damiean Lukes is a very pleasant 86 year old male with an underlying complex medical history of arthritis, chronic back pain, coronary artery disease with status post bypass in 1991, sick sinus syndrome with status post pacemaker placement some 15 years ago, mildly overweight state, history of stroke, tremor, and memory loss (followed by neurology in [facility]), who presents for follow-up consultation of his obstructive sleep apnea, on Inspire treatment. He had the hypoglossal nerve stimulator placed on 06/14/2020 under the care of Jake Woods.  He has a history of CPAP intolerance for his moderate obstructive sleep apnea.  His initial activation was on 07/27/2020. He had an Inspire titration sleep study on 10/30/2020.  He had a subsequent home sleep test while on inspire on 04/01/2022.   He was very consistent with his compliance in the beginning.  Currently in the past 6 months he has been 53% compliance with treatment.  He is not as consistent with his inspire usage. He has been on level 4, 3.5 V, since September 2022. His home sleep test from 04/01/2022 while on inspire treatment showed an AHI of 54.4/h, O2 nadir 81% with mostly  moderate snoring detected.  Today, we kept his start delay at 30 minutes, therapy duration was changed to 7 hours, pause was kept at 15 minutes, he has not really utilized the pause feature.  We changed his configuration to setting "B", level 2 which equals 0.9 V, with a range to 0.8-1.2.  His daughter will likely increase the settings, she is encouraged to try to increase the voltage every 2 weeks and go slower.  Maximum level can be level 5, 1.2 V.  He is advised to follow-up in this clinic for a recheck in 3 months and we may consider another home sleep test at that time.  I answered all their questions today and the patient and his daughter were in agreement. I spent 50 minutes in total face-to-face time and in reviewing records during pre-charting, more than 50%  of which was spent in counseling and coordination of care, reviewing test results, reviewing medications and treatment regimen and/or in discussing or reviewing the diagnosis of OSA, the prognosis and treatment options. Pertinent laboratory and imaging test results that were available during this visit with the patient were reviewed by me and considered in my medical decision making (see chart for details).

## 2022-06-13 NOTE — Patient Instructions (Signed)
It was nice to see you both again today.  We have changed your inspire duration to 7 hours total per night from 8 hours.  Please use your inspire device consistently every night and turn it on whenever you go to bed.  We have changed your inspire configuration today (to alternative setting B).  We will start you on level 2 which equals 0.9 V and you are encouraged to increase the settings every 2 weeks one level, to up to level 5, which would be 1.2 V.  Please follow-up in 3 months and we will consider another home sleep test at that time.  Wellness or email [place] through MyChart with any questions or concerns you may have.

## 2022-09-10 ENCOUNTER — Encounter: Payer: Self-pay | Admitting: Neurology

## 2022-09-10 ENCOUNTER — Ambulatory Visit (INDEPENDENT_AMBULATORY_CARE_PROVIDER_SITE_OTHER): Payer: Medicare Other | Admitting: Neurology

## 2022-09-10 VITALS — BP 155/84 | HR 83 | Ht 63.0 in | Wt 168.2 lb

## 2022-09-10 DIAGNOSIS — Z789 Other specified health status: Secondary | ICD-10-CM

## 2022-09-10 DIAGNOSIS — Z9682 Presence of neurostimulator: Secondary | ICD-10-CM

## 2022-09-10 DIAGNOSIS — G4733 Obstructive sleep apnea (adult) (pediatric): Secondary | ICD-10-CM | POA: Diagnosis not present

## 2022-09-10 NOTE — Patient Instructions (Signed)
It was nice to see you both again today.  You have done a great job using your inspire, try to be consistent with it and try not to skip any nights.  You have done a lot better compared to 3 months ago and we have increased your stimulation level from level 3 to level 4 today and we will have you stay on this.  We will proceed with a home sleep test in the next 2 to 3 weeks to assess your sleep apnea at home while you are on your inspire device.  We will follow-up after the home sleep test.  We will call you with the results.

## 2022-09-10 NOTE — Progress Notes (Signed)
Subjective:    Patient ID: Jake Woods is a 86 y.o. male.  HPI    Interim history:   Jake Woods is an 86 year old right-handed gentleman with an underlying medical history of arthritis, chronic back pain, coronary artery disease with status post bypass in 1991, sick sinus syndrome with status post pacemaker placement some 15 years ago, history of stroke, tremor, and memory loss (followed by neurology in [facility]), and overweight state, who presents for follow-up consultation of his obstructive sleep apnea, on Inspire.  He is accompanied by his daughter again today. The [employer], Donia Guiles, joins [place] for the visit, as well as our sleep Management consultant, Barrie Lyme. I last saw him on 06/13/2022, at which time he reported not being consistent with his bedtime, is not using inspire consistently.  We changed his configuration and to setting the and set his stimulation levelto 0.9 V, level 2.  He was encouraged to have a more regular bedtime and rise time routine and be more consistent with inspire usage is much as possible.  He was advised to gradually titrate up to up to level 5.    Today, 09/10/2022: I reviewed his Inspire download from the past 90 days from 06/12/2022 through 09/09/2022, during which time he used his inspire 75 out of 90 days with percent use days greater than 4 hours at 82%, average usage of over 7 hours.  He reports doing better.  He has had some discomfort when he had an increase in the stimulation, his daughter reports that they were able to go from level 2 to level 3 and he wanted to stay at level 3.  Upon interrogation today he had good toe movements and tolerance of level for which we set for his new level today and he was agreeable.  He does keep a more set schedule for his sleep time of months time, he is doing better in that regard.  The patient's allergies, current medications, family history, past medical history, past social history, past surgical history and problem  list were reviewed and updated as appropriate.    Previously:    I saw him on 07/24/2021, at which time he was using inspire but had not increased the voltage as discussed during the previous visit.  His stimulation level was increased to 3.5 V.  He was advised to proceed with a home sleep test for recheck on the efficacy of his inspire.  He had a home sleep test on 04/01/2022 which indicated severe residual obstructive sleep apnea reportedly while using his inspire, total AHI was 54.4/h, O2 nadir 81%.  We tried to get in touch with him to get an inspire download for that night but were not successful in getting him to return to the office with a remote to review a download for that date.      I saw him for his inspire adjustment appointment after his Inspire titration study on 11/30/20, at which time he was doing fairly well.  He was encouraged to continue to titrate up and we changed his level of stimulation from 3.3 V to 3.4 V at the time.     I saw him on 07/27/20 for is Inspire activation. He had the hypoglossal nerve stimulator placed on 06/14/2020 without any problem under the care of Dr. Melida Quitter. He has a history of CPAP intolerance for his moderate obstructive sleep apnea.  His initial activation went well, he was set to an initial amplitude of 2.4 V with the goal  to titrate over the next 3 months to up to 3.4 V. His start delay was 30 minutes, 8 hours of therapy time and pause time of 15 minutes. He was scheduled for a titration study.    He had an Inspire titration study on 10/30/20. His overall sleep efficiency was 84.4%, sleep latency 1.5 minutes, REM latency 32.5 minutes.  He did have significant sleep fragmentation especially later in the night.  It was a somewhat difficult titration.  Recommended therapeutic pressure was 3.5 V for now. The patient's incoming amplitude was 3.3 V.  Inspire was titrated to maximum amplitude of 4.1 V.  His therapeutic amplitude was found to be around 3.5 V at  which point his AHI was estimated to be 14/h. After completion of the study the patient was programmed back to the incoming amplitude of 3.3 V.  Lower limit was programmed to be 0.2 V below the incoming amplitude and upper limit was programmed to be 1 V above the lower limit. He was advised that the final settings will be programmed after his follow-up visit.        I first met him on 07/26/2020 at the request of Dr. Melida Quitter in ENT, at which time the patient reported a prior diagnosis of sleep apnea and intolerance to CPAP therapy in the past.  He had evaluation through ENT for inspire candidacy and had a hypoglossal nerve stimulator implanted successfully in July 2021.      07/26/20: (He) was diagnosed with obstructive sleep apnea but could not tolerate CPAP.  He had a sleep study in [facility] in March 2019 which showed an AHI of 27.2/h, O2 nadir of 78%. He reports that he tried CPAP for about a month.  He gave the machine back.  He has not been sleeping well.  His snoring bothers his wife but his wife's CPAP also bothers him.  He tries to be in bed around 10 PM, rise time is generally between 6 and 7.  He has 2 brothers with sleep apnea and 1 son with sleep apnea as well.  He does take naps during the day, stays in his recliner and falls asleep inadvertently.  They have 2 dogs and 1 cat in the household.  He drinks no daily caffeine, soda occasionally, tea occasionally.  He quit smoking some 35 years ago and drinks alcohol rarely.  He had inspire evaluation and I reviewed your records.  He had the implantation on 06/14/2020.  He has healed well.  He did have a fall the day after he was released home.  He had to get checked out in [facility] in the emergency room. I reviewed the emergency room records from 06/15/2020.  He had a head CT and cervical spine CT without contrast and I reviewed the results: IMPRESSION: 1. Mild diffuse cortical atrophy. Mild chronic ischemic white matter disease. No acute  intracranial abnormality seen. 2. Multilevel degenerative disc disease. No acute abnormality seen in the cervical spine.   He also had a chest x-ray without any acute cardiopulmonary disease seen.   His Epworth sleepiness score is 14 out of 24, fatigue severity score is 14 out of 63.   His Past Medical History Is Significant For: Past Medical History:  Diagnosis Date   Arthritis    hands   Chronic back pain    Coronary artery disease 1991   CABG at [facility]   Diabetes mellitus without complication ([place])    Hepatitis    not sure which  one   Hypertension    Memory deficits    Presence of permanent cardiac pacemaker    SSS   Sleep apnea    did not tolerate CPAP   Spondylosis of lumbar spine    Stroke ([place])    (post CABG per patient's record)   Tremor     His Past Surgical History Is Significant For: Past Surgical History:  Procedure Laterality Date   CORONARY ARTERY BYPASS GRAFT  1991   DRUG INDUCED ENDOSCOPY N/A 05/03/2020   Procedure: DRUG INDUCED ENDOSCOPY;  Surgeon: Melida Quitter, MD;  Location: [place];  Service: ENT;  Laterality: N/A;   IMPLANTATION OF HYPOGLOSSAL NERVE STIMULATOR Right 06/14/2020   Procedure: IMPLANTATION OF HYPOGLOSSAL NERVE STIMULATOR;  Surgeon: Melida Quitter, MD;  Location: [place];  Service: ENT;  Laterality: Right;   IR RADIOLOGIST EVAL & MGMT  07/28/2019   IR RADIOLOGIST EVAL & MGMT  10/28/2019   IR RADIOLOGIST EVAL & MGMT  02/15/2020   IR RADIOLOGIST EVAL & MGMT  03/15/2021   LAPAROSCOPIC ABLATION RENAL MASS Right    PACEMAKER INSERTION      His Family History Is Significant For: Family History  Problem Relation Age of Onset   Sleep apnea Brother     His Social History Is Significant For: Social History   Socioeconomic History   Marital status: Married    Spouse name: Not on file   Number of children: Not on file   Years of education: Not on file   Highest education level: Not on file  Occupational History   Not on file   Tobacco Use   Smoking status: Former    Packs/day: 3.00    Years: 30.00    Total pack years: 90.00    Types: Cigarettes    Quit date: 61    Years since quitting: 39.8   Smokeless tobacco: Never  Substance and Sexual Activity   Alcohol use: Not Currently   Drug use: Never   Sexual activity: Not on file  Other Topics Concern   Not on file  Social History Narrative   Lives with wife   Social Determinants of Health   Financial Resource Strain: Not on file  Food Insecurity: Not on file  Transportation Needs: Not on file  Physical Activity: Not on file  Stress: Not on file  Social Connections: Not on file    His Allergies Are:  Allergies  Allergen Reactions   Ambien [Zolpidem] Other (See Comments)    confusion   Viagra [Sildenafil] Other (See Comments)    flushing   Penicillins Rash and Other (See Comments)    Welts.  :   His Current Medications Are:  Outpatient Encounter Medications as of 09/10/2022  Medication Sig   aspirin EC 81 MG tablet Take 81 mg by mouth daily.   atorvastatin (LIPITOR) 80 MG tablet Take 80 mg by mouth at bedtime.    Cyanocobalamin (RA VITAMIN B12) 2000 MCG TBCR Take 2,000 mcg by mouth daily.   donepezil (ARICEPT) 10 MG tablet Take 10 mg by mouth in the morning and at bedtime.    Glucosamine HCl 1500 MG TABS Take 1,500 mg by mouth in the morning and at bedtime.   memantine (NAMENDA) 10 MG tablet Take 10 mg by mouth 2 (two) times daily.   metoprolol succinate (TOPROL-XL) 50 MG 24 hr tablet Take 50 mg by mouth daily. Take with or immediately following a meal.   Multiple Vitamin (MULTIVITAMIN WITH MINERALS) TABS tablet Take  1 tablet by mouth daily. Chewable   Omega-3 Fatty Acids (FISH OIL) 1200 MG CAPS Take 1,200 mg by mouth in the morning and at bedtime.   potassium chloride (KLOR-CON) 10 MEQ tablet Take 10 mEq by mouth daily.   sertraline (ZOLOFT) 25 MG tablet Take 50 mg by mouth daily.   silodosin (RAPAFLO) 4 MG CAPS capsule Take 4 mg by mouth  at bedtime.    telmisartan (MICARDIS) 80 MG tablet Take 80 mg by mouth daily.   tolterodine (DETROL LA) 4 MG 24 hr capsule Take 4 mg by mouth daily.   No facility-administered encounter medications on file as of 09/10/2022.  :  Review of Systems:  Out of a complete 14 point review of systems, all are reviewed and negative with the exception of these symptoms as listed below:   Review of Systems  Neurological:        Inspire follow up.  ESS 11. Did not increase as he expressed there was some discomfort and felt was doing ok where he was.     Objective:  Neurological Exam  Physical Exam Physical Examination:   Vitals:   09/10/22 0845  BP: (!) 155/84  Pulse: 83    General Examination: The patient is a very pleasant 86 y.o. male in no acute distress. He appears well-developed and well-nourished and well groomed.  Good spirits.  HEENT: Normocephalic, atraumatic, tracking is well preserved. Face is symmetric with normal facial animation. Speech is clear with no dysarthria noted. Well-healed scar under the right side of the chin.     Chest:  Stimulation and sensing went well.  Chest clear to auscultation, unremarkable midline scar on the chest, unremarkable pacemaker site, unremarkable inspire site on the right upper chest.   Abdomen: Soft, non-tender and non-distended.   Extremities: There is no obvious edema in the distal lower extremities bilaterally.    Skin: Warm and dry without trophic changes noted.    Musculoskeletal: exam reveals no obvious joint deformities.    Neurologically:  Mental status: The patient is awake, alert and cooperative.  Comprehension is good, he has word finding difficulty.  Mood is normal and affect is normal.  Cranial nerves II - XII are as described above under HEENT exam.  Motor exam: Normal bulk, normal movements of all 4 extr. He does have an intermittent mild to moderate tremor in the upper extremities bilaterally, left more than right  including a resting component in the left upper extremity. Fine motor skills and coordination: Mildly impaired.  Cerebellar testing: No dysmetria or intention tremor. There is no truncal or gait ataxia.  Sensory exam: intact to light touch in the upper and lower extremities.    Assessment and Plan:  In summary, Jake Woods is a very pleasant 86 year old male with an underlying complex medical history of arthritis, chronic back pain, coronary artery disease with status post bypass in 1991, sick sinus syndrome with status post pacemaker placement some 15 years ago, mildly overweight state, history of stroke, tremor, and memory loss (followed by neurology in [facility]), who presents for follow-up consultation of his obstructive sleep apnea, on Inspire treatment. He had the hypoglossal nerve stimulator placed on 06/14/2020 under the care of Dr. Melida Quitter.  He has a history of CPAP intolerance for his moderate obstructive sleep apnea.  His initial activation was on 07/27/2020. He had an Inspire titration sleep study on 10/30/2020.  He had a subsequent home sleep test while on inspire on 04/01/2022.  He was  very compliant with his inspire in the beginning but had reduction in his compliance in the interim.  He is very good with his inspire compliance at this time.  His home sleep test from 04/01/2022 while on inspire treatment showed an AHI of 54.4/h, O2 nadir 81% with mostly moderate snoring detected. Today, we kept his start delay at 30 minutes.  We changed his configuration to setting "B", level 2 equaling 0.9 V in July 2023, with a range to 0.8-1.2.  We increased this to level 4 today, 1.1 V.  He has been more consistent with his usage, he is commended for this.  We will plan a home sleep test in the next 2 to 3 weeks and call him with the results and plan of follow-up accordingly in clinic.  He is reminded to be consistent with his inspire, I answered all the questions today and the patient and his daughter were  in agreement with our plan.   I spent 30 minutes in total face-to-face time and in reviewing records during pre-charting, more than 50% of which was spent in counseling and coordination of care, reviewing test results, reviewing medications and treatment regimen and/or in discussing or reviewing the diagnosis of OSA, the prognosis and treatment options. Pertinent laboratory and imaging test results that were available during this visit with the patient were reviewed by me and considered in my medical decision making (see chart for details).

## 2023-11-19 DEATH — deceased
